# Patient Record
Sex: Female | Born: 1985
Health system: Southern US, Community
[De-identification: ages and names within clinical notes are randomized; demographics above are authoritative.]

## PROBLEM LIST (undated history)

## (undated) DIAGNOSIS — R87810 Cervical high risk human papillomavirus (HPV) DNA test positive: Secondary | ICD-10-CM

## (undated) DIAGNOSIS — A749 Chlamydial infection, unspecified: Secondary | ICD-10-CM

## (undated) DIAGNOSIS — R87610 Atypical squamous cells of undetermined significance on cytologic smear of cervix (ASC-US): Secondary | ICD-10-CM

## (undated) HISTORY — DX: Atypical squamous cells of undetermined significance on cytologic smear of cervix (ASC-US): R87.610

## (undated) HISTORY — PX: COLPOSCOPY W/ BIOPSY / CURETTAGE: SUR283

## (undated) HISTORY — PX: GYNECOLOGIC CRYOSURGERY: SHX857

## (undated) HISTORY — PX: INSERTION OF IMPLANON ROD: OBO 1005

## (undated) HISTORY — PX: POLYPECTOMY: SHX149

## (undated) HISTORY — DX: Cervical high risk human papillomavirus (HPV) DNA test positive: R87.810

## (undated) HISTORY — DX: Chlamydial infection, unspecified: A74.9

---
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
## 2010-06-28 ENCOUNTER — Inpatient Hospital Stay (HOSPITAL_COMMUNITY)
Admission: AD | Admit: 2010-06-28 | Discharge: 2010-06-30 | Payer: Self-pay | Source: Home / Self Care | Attending: Obstetrics | Admitting: Obstetrics

## 2010-06-29 LAB — CBC
HCT: 33.4 % — ABNORMAL LOW (ref 36.0–46.0)
HCT: 31 % — ABNORMAL LOW (ref 36.0–46.0)
Hemoglobin: 10.9 g/dL — ABNORMAL LOW (ref 12.0–15.0)
Hemoglobin: 10.1 g/dL — ABNORMAL LOW (ref 12.0–15.0)
MCH: 28.5 pg (ref 26.0–34.0)
MCHC: 32.6 g/dL (ref 30.0–36.0)
MCV: 87.2 fL (ref 78.0–100.0)
MCV: 88.1 fL (ref 78.0–100.0)
RBC: 3.83 MIL/uL — ABNORMAL LOW (ref 3.87–5.11)

## 2010-06-29 LAB — RPR: RPR Ser Ql: NONREACTIVE

## 2010-10-21 NOTE — Op Note (Signed)
NAMEVIRGA, HALTIWANGER         ACCOUNT NO.:  [id number]   MEDICAL RECORD NO.:  [id number]          PATIENT TYPE:  INP   LOCATION:  0101                         FACILITY:  [facility]   PHYSICIAN:  Angelia Mould. Derrell Lolling, M.D.DATE OF BIRTH:  06-12-1985   DATE OF PROCEDURE:  07/25/2004  DATE OF DISCHARGE:                                 OPERATIVE REPORT   PREOPERATIVE DIAGNOSIS:  Acute appendicitis.   POSTOPERATIVE DIAGNOSIS:  Acute appendicitis.   OPERATION/PROCEDURE:  Laparoscopic appendectomy.   SURGEON:  Angelia Mould. Derrell Lolling, M.D.   OPERATIVE INDICATIONS:  This is a 25 year old Hispanic female.  Her parents  live in [place].  She immigrated here five months ago and works in  [place].  She presented to the [facility] Emergency Room with a 12-hour  history of right lower quadrant pain, nausea and repeated vomiting.  On  examination, she was found to have significant localized tenderness,  guarding and rebound in the right lower quadrant.  A CT scan was obtained  which showed a markedly inflamed appendix with some fluid around it but no  evidence of abscess.  She was brought to the operating room urgently for  appendectomy.   OPERATIVE FINDINGS:  The appendix was acutely inflamed.  There was a little  bit of free fluid and ascites but no evidence of rupture. No evidence of  gangrene.  There was localized exudate with the omentum stuck to the  appendix.  Cecum and terminal ileum looked normal.  The liver and  gallbladder looked normal.  No other abnormalities were noticed.   DESCRIPTION OF PROCEDURE:  Following the induction of general endotracheal  anesthesia, a Foley catheter was inserted.  The abdomen was prepped and  draped in a sterile fashion.  Intravenous antibiotics were given.  0.5%  Marcaine with epinephrine was used as a local infiltration anesthetic.  A  vertically oriented incision was made at the superior rim of the umbilicus.  The fascia was incised in the midline and  abdominal cavity entered under  direct vision.  A 10 mm Hasson trocar was inserted and secured with a  pursestring suture of 0 Vicryl.  Pneumoperitoneum was created.  Video camera  was inserted with visualization and findings as described above.  The  patient was positioned in Trendelenburg and rotated to the left.  A 12 mm  trocar was placed in the left suprapubic area and a 5 mm trocar placed  through the lateral aspect of the left rectus sheath just below the  umbilicus.  We teased and dissected the omentum off the appendix and then  mobilized the appendix until we could identify the tip of the appendix and  traced it all the way back to the insertion on the cecum.  Using the  Harmonic scalpel, we divided some of the lateral peritoneal attachments to  mobilize the appendix and cecum medially so the anatomy became clear.  We  did this very carefully.  We then divided the appendiceal artery and the  mesoappendix with several firings of the Harmonic scalpel until we had  completely skeletonized the appendix and could clearly see its insertion at  the  base of the cecum.  An endo-GIA stapling device was then inserted and  placed across the base of the appendix at the cecum.  The stapler was closed  and held in place for about 40 seconds and then fired and removed.  The  staple line on the appendix and on the cecum were very good.  There was no  bleeding.  The appendix was placed in the specimen bag and removed.   The operative field and the pelvis and the subphrenic space were irrigated  with saline and irrigation fluid was completely clear.  We inspected the  terminal ileum and cecum and everything looked fine.  The trocars were  removed under direct vision and there was no bleeding from the trocar sites.  The pneumoperitoneum was released.  The fascia at the umbilicus and the  fascia at the 12 mm trocar site in the suprapubic area were closed with 0  Vicryl sutures.  These were irrigated  with saline and the skin closed with  subcuticular sutures of 4-0 Monocryl and Steri-Strips.  Kling bandages were  placed and the patient taken to the recovery room in stable condition.  Estimated blood loss was about 10 mL.  No complications.  Sponge, needle and  instrument counts were correct.      HMI/MEDQ  D:  07/23/2004  T:  07/25/2004  Job:  [id number]

## 2010-10-21 NOTE — H&P (Signed)
Crystal Graves, Crystal Graves         ACCOUNT NO.:  [id number]   MEDICAL RECORD NO.:  [id number]          PATIENT TYPE:  INP   LOCATION:  0476                         FACILITY:  [facility]   PHYSICIAN:  Angelia Mould. Derrell Lolling, M.D.DATE OF BIRTH:  1985-06-24   DATE OF ADMISSION:  07/23/2004  DATE OF DISCHARGE:                                HISTORY & PHYSICAL   CHIEF COMPLAINT:  Right lower quadrant abdominal pain and vomiting.   HISTORY OF PRESENT ILLNESS:  This is a 25 year old otherwise healthy  Hispanic female who states that she has had right lower quadrant pain for 12  hours.  This has been progressive.  She has vomited several times.  She  denies diarrhea or alteration in her bowel habits.  She denies fever or  chills.  Last menstrual period was July 18, 2004 and was normal.  She  has never been pregnant.   She came to the emergency room.  She was evaluated by the emergency  department staff.  CT scan was obtained which showed a thickened inflamed  appendix with some fluid around the appendix and was read as acute  appendicitis by the radiologist.  There were no other abnormalities seen on  the CT scan.  I was then called at that point.   She is being admitted for appendectomy.   PAST HISTORY:  She has been healthy.  She has never had any surgical  procedure.  She has no medical problems.  She has never been hospitalized.  She takes no medications and she denies any drug allergies.   SOCIAL HISTORY:  The patient has lived in the [place] for the past 5  months.  Her parents live in [place].  She lives with cousins.  She is  single.  She has a boyfriend here with her tonight and also a female  employer is with her.  She states that she works at the [employer]  here in [place].  She denies the use of alcohol or tobacco.   FAMILY HISTORY:  Mother and father live in [place].  They are living and  well.  No familial disease.   REVIEW OF SYSTEMS:  All systems are  reviewed.  They are noncontributory  except as described above.   PHYSICAL EXAMINATION:  GENERAL:  Thin healthy-appearing Hispanic woman in  moderate distress.  She is cooperative.  The entire encounter is performed  with her female employer present as well as Gerilyn Nestle, an interpreter that is  employed in the emergency room.  VITAL SIGNS:  Blood pressure 104/71, temperature 99.1, pulse 107,  respirations 18, oxygen saturation 99% on room air.  EYES:  Sclerae are clear.  Extraocular movements intact.  EARS/NOSE/MOUTH AND THROAT:  Nose, lips, tongue and oropharynx are without  gross lesions.  NECK:  Supple, nontender, no adenopathy, no thyroid mass, no jugular venous  distention.  LUNGS:  Clear to auscultation, no chest wall tenderness, no CVA tenderness.  HEART:  Regular rate and rhythm with a grade 2/6 systolic murmur, no ectopy.  BREASTS:  Not examined.  ABDOMEN:  Soft, not distended, hypoactive bowel sounds, she has significant  tenderness  in the right lower quadrant and minimal tenderness in the left  lower quadrant, in the right lower quadrant there is involuntary guarding  and direct rebound.  I do not palpate a mass or hernia.  EXTREMITIES:  She moves all four extremities well without pain or deformity.  NEUROLOGIC:  No gross motor or sensory deficits.   ADMISSION DATA:  CT scan shows an inflamed appendix enlarged and thickened  with fluid around it.  White count is 14,100, hemoglobin 14, urine pregnancy  test normal, urinalysis normal, basic metabolic panel normal.   ASSESSMENT:  Acute appendicitis.   PLAN:  1.  The patient will be taken to the operating room for diagnostic      laparoscopy and laparoscopic appendectomy.  2.  I have discussed the indication and details of surgery with her via the      interpreter in great detail.  Risks and complications have been      outlined, including but not limited to bleeding, infection, conversion      to open laparotomy, injury to  adjacent organs such as the intestine or      bladder or ureter with major reconstructive surgery, cardiac, pulmonary      and thromboembolic problems.  She seems to understand these issues well.      At this time all the questions were answered.  She is in full agreement      with this plan and would like to go ahead with the surgery.      HMI/MEDQ  D:  07/23/2004  T:  07/23/2004  Job:  [id number]

## 2014-01-20 ENCOUNTER — Emergency Department (HOSPITAL_COMMUNITY): Payer: BC Managed Care – PPO

## 2014-01-20 ENCOUNTER — Emergency Department (HOSPITAL_COMMUNITY)
Admission: EM | Admit: 2014-01-20 | Discharge: 2014-01-20 | Disposition: A | Discharge status: Home / Self Care | Payer: BC Managed Care – PPO | Attending: Emergency Medicine | Admitting: Emergency Medicine

## 2014-01-20 ENCOUNTER — Encounter (HOSPITAL_COMMUNITY): Payer: Self-pay | Admitting: Emergency Medicine

## 2014-01-20 DIAGNOSIS — S6980XA Other specified injuries of unspecified wrist, hand and finger(s), initial encounter: Secondary | ICD-10-CM | POA: Insufficient documentation

## 2014-01-20 DIAGNOSIS — S0993XA Unspecified injury of face, initial encounter: Secondary | ICD-10-CM | POA: Insufficient documentation

## 2014-01-20 DIAGNOSIS — IMO0002 Reserved for concepts with insufficient information to code with codable children: Secondary | ICD-10-CM | POA: Insufficient documentation

## 2014-01-20 DIAGNOSIS — Z23 Encounter for immunization: Secondary | ICD-10-CM | POA: Diagnosis not present

## 2014-01-20 DIAGNOSIS — T07XXXA Unspecified multiple injuries, initial encounter: Secondary | ICD-10-CM

## 2014-01-20 DIAGNOSIS — S199XXA Unspecified injury of neck, initial encounter: Secondary | ICD-10-CM

## 2014-01-20 DIAGNOSIS — S6990XA Unspecified injury of unspecified wrist, hand and finger(s), initial encounter: Secondary | ICD-10-CM | POA: Diagnosis not present

## 2014-01-20 DIAGNOSIS — S61259A Open bite of unspecified finger without damage to nail, initial encounter: Secondary | ICD-10-CM

## 2014-01-20 DIAGNOSIS — W503XXA Accidental bite by another person, initial encounter: Secondary | ICD-10-CM

## 2014-01-20 MED ORDER — AMOXICILLIN-POT CLAVULANATE 875-125 MG PO TABS: 1.0000 | ORAL_TABLET | Freq: Two times a day (BID) | ORAL | Status: DC

## 2014-01-20 MED ORDER — TETANUS-DIPHTH-ACELL PERTUSSIS 5-2.5-18.5 LF-MCG/0.5 IM SUSP
0.5000 mL | Freq: Once | INTRAMUSCULAR | Status: AC
  Administered 2014-01-20: 0.5 mL via INTRAMUSCULAR
  Filled 2014-01-20: qty 0.5

## 2014-01-20 MED ORDER — AMOXICILLIN-POT CLAVULANATE 875-125 MG PO TABS
1.0000 | ORAL_TABLET | Freq: Once | ORAL | Status: AC
  Administered 2014-01-20: 1 via ORAL
  Filled 2014-01-20: qty 1
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
## 2014-01-20 NOTE — ED Notes (Signed)
Patient's finger dressed with anti-microbial dressing and advised to use neosporin at home. Acknowledges understanding of abx regimen and to take until completed. AVS given in spanish. No other questions/concerns.

## 2014-01-20 NOTE — ED Notes (Signed)
Patient transported to X-ray 
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
## 2014-10-27 ENCOUNTER — Ambulatory Visit (INDEPENDENT_AMBULATORY_CARE_PROVIDER_SITE_OTHER): Payer: BLUE CROSS/BLUE SHIELD | Admitting: Family Medicine

## 2014-10-27 ENCOUNTER — Encounter: Payer: Self-pay | Admitting: Family Medicine

## 2014-10-27 VITALS — BP 100/66 | HR 69 | Ht 66.0 in | Wt 120.0 lb

## 2014-10-27 DIAGNOSIS — Z124 Encounter for screening for malignant neoplasm of cervix: Secondary | ICD-10-CM

## 2014-10-27 DIAGNOSIS — Z30431 Encounter for routine checking of intrauterine contraceptive device: Secondary | ICD-10-CM | POA: Diagnosis not present

## 2014-10-27 DIAGNOSIS — R87612 Low grade squamous intraepithelial lesion on cytologic smear of cervix (LGSIL): Secondary | ICD-10-CM | POA: Insufficient documentation

## 2014-10-27 DIAGNOSIS — Z01419 Encounter for gynecological examination (general) (routine) without abnormal findings: Secondary | ICD-10-CM | POA: Diagnosis not present

## 2014-10-27 NOTE — Progress Notes (Signed)
  Subjective:     Crystal Graves is a 29 y.o. female and is here for a comprehensive physical exam. The patient reports no problems. Has had IUD for 4.5 years, would like to change to Nexplanon.  Has had in past and is aware of bleeding profile.  History   Social History  . Marital Status: Single    Spouse Name: N/A  . Number of Children: N/A  . Years of Education: N/A   Occupational History  . Not on file.   Social History Main Topics  . Smoking status: Current Some Day Smoker  . Smokeless tobacco: Never Used  . Alcohol Use: Yes  . Drug Use: No  . Sexual Activity: Yes    Birth Control/ Protection: IUD   Other Topics Concern  . Not on file   Social History Narrative   Health Maintenance  Topic Date Due  . HIV Screening  05/15/2002  . PAP SMEAR  05/15/2005  . INFLUENZA VACCINE  01/04/2015  . TETANUS/TDAP  01/21/2024    The following portions of the patient's history were reviewed and updated as appropriate: allergies, current medications, past family history, past medical history, past social history, past surgical history and problem list.  Review of Systems A comprehensive review of systems was negative.   Objective:    BP 100/66 mmHg  Pulse 69  Ht 5\' 6"  (1.676 m)  Wt 120 lb (54.432 kg)  BMI 19.38 kg/m2 General appearance: alert, cooperative and appears stated age Head: Normocephalic, without obvious abnormality, atraumatic Neck: no adenopathy, supple, symmetrical, trachea midline and thyroid not enlarged, symmetric, no tenderness/mass/nodules Lungs: clear to auscultation bilaterally Breasts: normal appearance, no masses or tenderness Heart: regular rate and rhythm, S1, S2 normal, no murmur, click, rub or gallop Abdomen: soft, non-tender; bowel sounds normal; no masses,  no organomegaly Pelvic: cervix normal in appearance, external genitalia normal, no adnexal masses or tenderness, no cervical motion tenderness, uterus normal size, shape, and  consistency, vagina normal without discharge and IUD strings could not be visualized Extremities: extremities normal, atraumatic, no cyanosis or edema Pulses: 2+ and symmetric Skin: Skin color, texture, turgor normal. No rashes or lesions Lymph nodes: Cervical, supraclavicular, and axillary nodes normal. Neurologic: Grossly normal    Assessment:    Healthy female exam.      Plan:   Problem List Items Addressed This Visit    None    Visit Diagnoses    Encounter for routine gynecological examination    -  Primary    Screening for malignant neoplasm of cervix        Relevant Orders    Cytology - PAP       Return in 2 weeks (on 11/10/2014) for IUD removal and Nexplanon placement.    See After Visit Summary for Counseling Recommendations

## 2014-10-27 NOTE — Patient Instructions (Addendum)
Eleccin del mtodo anticonceptivo (Contraception Choices) La anticoncepcin (control de la natalidad) es el uso de cualquier mtodo o dispositivo para Therapist, occupational. A continuacin se indican algunos de esos mtodos. [place] contraconceptivo consiste en un tubo plstico delgado que contiene la hormona progesterona. No contiene estrgenos. El mdico inserta el tubo en la parte interna del brazo. El tubo puede Nutritional therapist durante 3 aos. Despus de los 3 aos debe retirarse. El implante impide que los ovarios liberen vulos (ovulacin), espesa el moco cervical, lo que evita que los espermatozoides ingresen al tero y hace ms delgada la membrana que cubre el interior del tero.  Inyecciones de progesterona sola: las Water engineer cada 3 meses para Therapist, occupational. La progesterona sinttica impide que los ovarios liberen vulos. Tambin hacen que el moco cervical se espese y modifique el tejido de recubrimiento interno del tero. Esto hace ms difcil que los espermatozoides sobrevivan en el tero.  Las pldoras anticonceptivas contienen estrgenos y Immunologist. Su funcin es [employer] ovarios liberen vulos (ovulacin). Las hormonas de los anticonceptivos orales hacen que el moco cervical se haga ms espeso, lo que evita que el esperma ingrese al tero. Las pldoras anticonceptivas son recetadas por el mdico.Tambin se utilizan para tratar los perodos menstruales abundantes.  Minipldora: este tipo de pldora anticonceptiva contiene slo hormona progesterona. Deben tomarse todos los das del mes y debe recetarlas el mdico.  El parche de control de natalidad: contiene hormonas similares a las que contienen las pldoras anticonceptivas. Deben cambiarse una vez por semana y se utilizan bajo prescripcin mdica.  Anillo vaginal: contiene hormonas similares a las que contienen las pldoras anticonceptivas. Se deja colocado durante tres  semanas, se lo retira durante 1 semana y luego se coloca uno nuevo. La paciente debe sentirse cmoda al insertar y retirar el anillo de la vagina.Es necesaria la prescripcin mdica.  Anticonceptivos de emergencia: son mtodos para evitar un embarazo despus de Ardelia Mems relacin sexual sin proteccin. Esta pldora puede tomarse inmediatamente despus de Office manager sexuales o hasta 5 [place] de haber tenido sexo sin proteccin. Es ms efectiva si se toma poco tiempo despus de la relacin sexual. Los anticonceptivos de emergencia estn disponibles sin prescripcin mdica. Consltelo con su farmacutico. No use los anticonceptivos de emergencia como nico mtodo anticonceptivo. MTODOS DE [place]   Condn masculino: es una vaina delgada (ltex o goma) que se coloca cubriendo al pene durante el acto sexual. Grier Rocher con espermicida para aumentar la efectividad.  Condn femenino. Es una funda delicada y blanda que se adapta holgadamente a la vagina antes de las Armed forces operational officer.  Diafragma: es una barrera de ltex redonda y suave que debe ser recomendado por un profesional. Se inserta en la vagina, junto con un gel espermicida. Debe insertarse antes de Clinical biochemist. Debe dejar el diafragma colocado en la vagina durante 6 a 8 horas despus de la relacin sexual.  Capuchn cervical: es una barrera de ltex o taza plstica redonda y [place] que cubre el cuello del tero y debe ser colocada por un mdico. Puede dejarlo colocado en la vagina hasta 48 horas despus [place].  Esponja: es una pieza blanda y circular de espuma de poliuretano. Contiene un espermicida. Se inserta en la vagina despus de mojarla y antes de las [employer].  Espermicidas: son sustancias qumicas que matan o bloquean al esperma y no lo dejan ingresar al cuello del tero y al tero.  Vienen en forma de cremas, geles, supositorios, espuma o comprimidos. No es necesario tener Furniture conservator/restorer. Se  insertan en la vagina con un aplicador antes de Clinical biochemist. El proceso debe repetirse cada vez que tiene relaciones sexuales. ANTICONCEPTIVOS INTRAUTERINOS  Dispositivo intrauterino (DIU) es un dispositivo en forma de T que se coloca en el tero durante el perodo menstrual, para Therapist, occupational. Hay dos tipos:  DIU de cobre: este tipo de DIU est recubierto con un alambre de cobre y se inserta dentro del tero. El cobre hace que el tero y las trompas de Falopio produzcan un liquido que [employer] espermatozoides. Puede permanecer colocado durante 10 aos.  DIU con hormona: este tipo de DIU contiene la hormona progestina (progesterona sinttica). La hormona espesa el moco cervical y evita que los espermatozoides ingresen al tero y tambin afina la membrana que cubre el tero para evitar la implantacin del vulo fertilizado. La hormona debilita o destruye los espermatozoides que ingresan al tero. Puede Nutritional therapist durante 3-5 aos, segn el tipo de DIU que se [place]. MTODOS ANTICONCEPTIVOS PERMANENTES  Ligadura de trompas en la mujer: se realiza sellando, atando u obstruyendo quirrgicamente las trompas de Falopio lo que impide que el vulo descienda hacia el tero.  Esterilizacin histeroscpica: Implica la colocacin de un pequeo espiral o la insercin en cada trompa de Falopio. El mdico utiliza una tcnica llamada histeroscopa para Teacher, music procedimiento. El dispositivo produce la formacin de tejido Pensions consultant. Esto da como resultado una obstruccin permanente de las trompas de Falopio, de modo que la esperma no pueda fertilizar el vulo. Demora alrededor de 3 meses despus del procedimiento hasta que el conducto se obstruye. Tendr que usar otro mtodo anticonceptivo durante al menos 3 meses.  Esterilizacin masculina: se realiza ligando los conductos por los que pasan los espermatozoides (vasectoma).Esto impide que el esperma ingrese a la vagina  durante el acto sexual. Luego del procedimiento, el hombre puede eyacular lquido (semen). MTODOS DE PLANIFICACIN NATURAL  Planificacin familiar natural: consiste en no Clinical biochemist o usar un mtodo de barrera (condn, [place], capuchn cervical) en los [employer] Insurance la mujer podra quedar [place].  Mtodo de calendario: consiste en el seguimiento de la duracin de cada ciclo menstrual y la identificacin de los perodos frtiles.  Mtodo de ovulacin: Dance movement psychotherapist las relaciones sexuales durante la ovulacin.  Mtodo sintotrmico: Energy manager sexuales en la poca en la que se est ovulando, utilizando un termmetro y tendiendo en cuenta los sntomas de la ovulacin.  Mtodo postovulacin: Museum/gallery conservator las relaciones sexuales para despus de haber ovulado. Independientemente del tipo o mtodo anticonceptivo que usted elija, es importante que use condones para protegerse contra las infecciones de transmisin sexual (ETS). Hable con su mdico con respecto a qu mtodo anticonceptivo es el ms apropiado para usted. Document Released: 05/22/2005 Document Revised: 01/22/2013 [facility] Patient Information 2015 [place]. This information is not intended to replace advice given to you by your health care provider. Make sure you discuss any questions you have with your health care provider.  Cuidados preventivos en los adultos (Preventive Care for Adults) Un estilo de vida saludable y los cuidados preventivos pueden favorecer la salud y [place]. Las pautas de salud preventivas para las mujeres incluyen las siguientes prcticas clave:  Un examen fsico de rutina anual y Optometrist estudios preventivos es un buen modo de Chief Technology Officer su salud. [place] de Publishing rights manager preocupaciones y Air traffic controller de  su salud, y que le realicen estudios completos.  Consulte al dentista para realizar un examen de rutina y cuidados preventivos cada 6  meses. Cepllese los dientes al Toys[employer]us veces por da y psese el hilo dental al menos una vez por da. Una buena higiene bucal evita caries y enfermedades de las encas.  La frecuencia con que debe hacerse exmenes de la vista depende de su edad, su estado de [place], su historia familiar, el uso de lentes de contacto y otros factores. Siga las recomendaciones del mdico para saber con qu frecuencia debe hacerse exmenes de la vista.  Consuma una dieta saludable. Los alimentos que contienen vegetales, las frutas, los cereales [place], los productos lcteos bajos en grasas y las protenas magras contienen nutrientes que son necesarios, sin consumir Nurse, mental health. Disminuya la ingesta de alimentos ricos en grasas slidas, azcares y sal agregadas. Consuma la cantidad de caloras adecuada para usted. Si es necesario, pdale informacin acerca de una dieta [place] a su mdico.  Realizar actividad fsica de forma regular es una de las prcticas ms importantes que puede hacer por su salud. Los adultos deben hacer al menos 150 minutos de ejercicios de intensidad moderada (cualquier actividad que aumente la frecuencia cardaca y lo haga transpirar) cada semana. Adems, la [employer] de los adultos necesita practicar ejercicios de fortalecimiento muscular dos o ms veces por semana.  Mantenga un peso saludable. El ndice de masa corporal [facility]) es una herramienta que identifica posibles problemas con [place]. Proporciona una estimacin de la grasa corporal basndose en el peso y la altura. El mdico podr determinar su [facility] y ayudarlo a Scientist, forensic o Theatre manager un peso saludable. Para los adultos de 20 aos o ms:  Un [facility] menor de 18,5 se considera bajo peso.  Un [facility] entre 18,5 y 24,9 es normal.  Un [facility] entre 25 y 29,9 se considera sobrepeso.  Un IMC de 30 o ms se considera obesidad.  Realice actividad fsica y evite ingerir grasas saturadas para mantener un nivel normal de lpidos y Psychiatric nurse. Consuma una dieta equilibrada y saludable, e incluya variedad de frutas y Photographer. A partir de los 20 aos se deben realizar anlisis de sangre a fin de Freight forwarder nivel de lpidos y colesterol en la sangre y [place] cada 5 aos. Si los niveles de lpidos o colesterol son altos, tiene ms de 50 aos o tiene riesgo elevado de sufrir enfermedades cardacas, Designer, industrial/product controlarse con ms frecuencia. Si tiene Coca Cola de lpidos y colesterol, debe recibir tratamiento con medicamentos, si la dieta y el ejercicio no estn funcionando.  Si fuma, consulte con el mdico acerca de las opciones para dejar de [place]. Si no fuma, no comience.  Se recomienda realizar exmenes de deteccin de cncer de pulmn a personas adultas entre 23 y 33 aos que estn en riesgo de Horticulturist, commercial de pulmn por sus antecedentes de consumo de tabaco. Para quienes hayan fumado durante 30 aos un paquete diario, y sigan fumando o hayan dejado el hbito en algn momento en los ltimos 15 aos, se recomienda realizarse una tomografa computarizada de baja dosis de los pulmones todos los aos. Fumar durante un ao-paquete equivale a fumar un promedio de un paquete de cigarrillos diario durante un ao (por ejemplo: un paquete por da durante [place] paquetes por da durante 15 aos). Los exmenes anuales deben continuar hasta que el fumador haya dejado de fumar durante un mnimo de 15 aos. Ya no deben realizarse  en personas que tengan un problema de salud que les impida recibir tratamiento para el cncer de pulmn.  Si est embarazada, no beba alcohol. Si est amamantando, beba alcohol con prudencia. Si no est embarazada y decide beber alcohol, no beba ms de Naval architect. Se considera una medida a 12onzas (340m) de cerveza, 5onzas (1461m de vino, o 1,4,[id number] licor.  Evite el consumo de drogas. No comparta agujas. Pida ayuda si necesita asistencia o instrucciones con respecto a  abandonar el consumo de drogas.  La hipertensin arterial causa enfermedades cardacas y auSerbial riesgo de ictus. Debe controlar su presin arterial al menos cada uno o doEagle RiverLa hipertensin arterial que persiste debe tratarse con medicamentos si la prdida de peso y el ejercicio no son efectivos.  Si tiene entre [id number], consulte a su mdico si debe tomar aspirina para prevenir ictus.  Los anlisis para la diabetes incluyen la toma de unTanzaniae sangre para controlar el nivel de azcar en la sangre durante el ayOrange CityDebe hacerlo cada 3 aos despus de los 4552os si est dentro de su peso normal y sin factores de riesgo para la diabetes. Las pruebas deben comenzar a edades tempranas o llevarse a cabo con ms frecuencia si tiene sobrepeso y al menos un factor de riesgo para la diabetes.  Las evaluaciones para dePublic affairs consultante mama son un mtodo preventivo fundamental para las mujeres. Debe practicar la "autoconciencia de las mamas". Esto significa que deChief Technology Officerpariencia normal de sus mamas y cmo se sienten, y haElectrical engineern autoexamen de maGlass blower/designerSi detecta algn cambio, no importa cun pequeo sea, debe informarlo a su mdico. Las muConAgra Foods0 y 3086os deben hacerse un examen clnico de las mamas como parte del examen regular de saEast Pecoscada uno a tres aos. Despus de los [id number]deben haCoca-ColaA partir de los [id number] [place] Lanedeben hacerse una mamografa (radiografa de mamas) cada ao. Las mujeres con antecedentes familiares de cncer de mama deben hablar con el mdico para someterse a un estudio gentico. Las que tienen ms riesgo deben hacerse una resonancia magntica y unLavinia Sharpsodos loRed Cliff La evaluacin del riesgo de cncer relacionado con el gen del cncer de mama (BRCA) se recomienda a mujeres que tengan familiares con cncer relacionado con el BRCA. Los cnceres relacionados con el BRCA incluyen el cncer de mama, de ovario, de trompas y  peritoneal. TeRaynelle Janamiliares con estos cnceres puede estar asociado con un mayor riesgo de cambios dainos (mutaciones) en los genes del cncer de mama BRCA1 y BRCA2. Los resultados de la evaluacin determinarn la necesidad de asesoramiento gentico y de anFelidae BRCA1 y BRCA2.  Ya no se recomiendan los exmenes plvicos de rutina para la deteccin del cncer en las mujeres que no estn embarazadas que son consideradas sujetos de bajo riesgo de teBest boyncer de los rganos de la pelvis (ovarios, tero y vagina) y no tienen sntomas. Pregntele al mdico si un examen plvico de deteccin es adecuado para usted.  Si ha recibido un tratamiento para elScience writerervical o una enfermedad que podra causar cncer, necesitar realizarse una prueba de Papanicolaou y controles durante al menos 2061os de concluido el trAllenSi no se ha hecho el Papanicolaou con regularidad, debern volver a evaluarse los factores de riesgo (como tener un nuevo compaero sexual), para deTeacher, adult educationi debe realizarse los estudios nuevamente. Algunas mujeres sufren problemas mdicos  que aumentan la probabilidad de Museum/gallery curator cncer de cuello del tero. En estos casos, el mdico podr [employer] se realicen controles y pruebas de Papanicolaou con ms frecuencia.  La prueba del VPH es un anlisis adicional que puede usarse para Film/video editor de cuello del tero. Esta prueba busca la presencia del virus que causa los cambios en el cuello. Las clulas que se recolectan durante la prueba de Papanicolaou pueden usarse para el VPH. Se debe realizar la prueba para la deteccin del VPH a mujeres de ms de 55 aos y a Midwife de cualquier edad [place] de la prueba de Papanicolaou no sean claros. Despus de los 30 aos, las mujeres deben hacerse el anlisis para el VPH con la misma frecuencia que la prueba de Papanicolaou.  El cncer colorrectal puede detectarse y con frecuencia puede prevenirse. La mayor parte de los estudios de  rutina se deben Medical laboratory scientific officer a Field seismologist a Proofreader de los 33 aos y [place] 56 aos. Sin embargo, el mdico podr aconsejarle que lo haga antes, si tiene factores de riesgo para el cncer de colon. Una vez por ao, el mdico le dar un kit de prueba para Hydrologist en la materia fecal. La utilizacin de un tubo con una pequea cmara en su extremo para examinar directamente el colon (sigmoidoscopa o colonoscopa), puede detectar formas tempranas de cncer colorrectal. Hable con su mdico si tiene 59 aos, edad en la que debe comenzar a Optometrist los estudios de [place]. El examen directo del colon debe repetirse cada 5 a 10 aos, hasta los 75 aos, excepto que se encuentren formas tempranas de plipos precancerosos o pequeos bultos.  Las personas con un riesgo mayor de Insurance risk surveyor hepatitis B deben realizarse anlisis para Futures trader virus. Se considera que tiene un alto riesgo de Museum/gallery curator hepatitis B si:  Naci en un pas donde la hepatitis B es frecuente. Pregntele a su mdico qu pases son considerados de Public affairs consultant.  Sus padres nacieron en un pas de alto riesgo y usted no recibi una vacuna que lo proteja contra la hepatitis B (vacuna contra la hepatitis B).  [place].  [place] agujas para inyectarse drogas.  Vive o tiene sexo con alguien que tiene hepatitis B.  Recibe tratamiento de hemodilisis.  Toma ciertos medicamentos para Nurse, mental health, trasplante de rganos y afecciones autoinmunes.  Se recomienda realizar un anlisis de sangre para Hydrographic surveyor hepatitis C a todas las personas nacidas entre 1945 y 1965, y a todo aquel que sepa que tiene riesgo de haber contrado esta enfermedad.  Practique el sexo seguro. Use condones y evite las prcticas sexuales riesgosas para disminuir el contagio de enfermedades de transmisin sexual (ETS). Algunas ETS son la gonorrea, clamidia, sfilis, tricomoniasis, herpes, VPH y el virus de inmunodeficiencia humana (VIH). El herpes, el VIH y  [place] VPH son enfermedades virales que no tienen [place]. Pueden producir discapacidad, cncer y [employer] Corporation.  Debe realizarse pruebas de deteccin de enfermedades de transmisin sexual (ETS), incluidas gonorrea y clamidia si:  Es sexualmente activa y es menor de 24aos.  Es mayor de 24aos, y Investment banker, operational informa que corre riesgo de tener este tipo de [place].  La actividad sexual ha cambiado desde que le hicieron la ltima prueba de deteccin y tiene un riesgo mayor de Best boy clamidia o Radio broadcast assistant. Pregntele al mdico si usted tiene riesgo.  Si tiene riesgo de infectarse por el VIH, se recomienda tomar diariamente un medicamento recetado para evitar la infeccin.  Esto se conoce como profilaxis previa a la exposicin. Se considera que est en riesgo si:  Es Dalene Seltzer heterosexual, es sexualmente [place] y tiene ms riesgo de Museum/gallery curator una infeccin por VIH.  Se inyecta drogas.  Es sexualmente activo con una pareja que tiene VIH.  Consulte a su mdico para saber si tiene un alto riesgo de infectarse por el VIH. Si opta por comenzar la profilaxis previa a la exposicin, primero debe realizarse anlisis de deteccin del VIH. Luego, le harn anlisis cada 24mses mientras est tomando los medicamentos para la profilaxis previa a la exposicin.  La osteoporosis es una enfermedad en la que los huesos pierden los minerales y la fuerza por el avance de la edad. El resultado pueden ser fracturas o quebraduras graves en l[place] El riesgo de osteoporosis puede identificarse con uArdelia Memsprueba de densidad sea. Las mujeres de ms de 614aos y las que tengan riesgos de sufrir fracturas u osteoporosis deben discutir las opciones de control con su mdico. Consulte a su mdico si debe tomar un suplemento de calcio o de vitamina D para reducir el riesgo de osteoporosis.  La menopausia est asociada a sntomas y riesgos fsicos. Se dispone de una terapia de reemplazo hormonal para disminuir los sntomas y l[place] Consulte a su mdico para saber si la terapia de reemplazo hormonal es conveniente para usted.  Utilice pantalla solar. Aplique pantalla solar de mKerry Doryy repetida a lo largo del dTraining and development officer Resgurdese del sol cuando la sombra sea ms pequea que usted. Protjase usando mangas y pantalones largos, un sombrero de ala ancha y anteojos para el sol todo el ao, siempre que se encuentre al aDranesville  Una vez por mes hgase un examen de la piel de todo el cuerpo usando un espejo para ver la espalda. Informe al mdico si aparecen nuevos lunares, o si nota que los que ya tena ahora tienen bordes iSt. Mary aumentaron su tamao y son ms grandes que una goma de lpiz o si su forma o color cambi.  Mantngase al da con las vacunas obligatorias .  Vacuna antigripal. Todas las personas adultas deben vacunarse cada ao.  Vacuna contra la difteria, ttanos y pAdvice worker(DT, DTPa). Las mujeres embarazadas deben recibir una dosis de la vacuna DTPa en cada embarazo. Se debe recibir la dosis independientemente de cunto tiempo haya transcurrido desde la ltima dosis. Es preferible vacunarse entre la semana 244y 339de la gestacin. Una persona adulta que no haya recibido la vacuna DTPa anteriormente o que no sabe su estado de vacunacin debe recibir una dosis. Despus de esta dosis inicial, necesitar aplicarse un refuerzo de la vacuna contra la difteria y el ttanos (DT) cada 10 aos. Las pShip brokerque no sepan o no hayan recibido la serie de vacunacin de tres dosis contra la difteria y el ttanos deben iniciar o finalizar una serie de vacunacin primaria, que incluye la dosis contra la difteria, el ttanos y lResearch officer, trade union(DTPa). Las personas adultas deben recibir una dosis de refuerzo de DT cada 10 aos.  Vacuna contra la varicela. UArdelia Memspersona adulta sin prueba de inmunidad a la varicela debe recibir dos dosis o una segunda dosis si recibi una dosis previamente. Las embarazadas sin  prueba de inmunidad deben recibir la primera dosis despus del eMedia planner Esta primera dosis se debe aplicar antes del alta del centro de salud. La segunda dosis debe aplicarse entre 4 y 8 semanas posteriores a la primera dosis.  VEdward Jolly  contra el virus del Engineer, technical sales (VPH). Las ConAgra Foods 13 y 20 aos que no hayan recibido la vacuna antes deben recibir la serie de 3 dosis. La vacuna no se recomienda en mujeres embarazadas. Sin embargo, no es Chartered loss adjuster una prueba de [place] antes de recibir una dosis. Si se descubre que una mujer est embarazada despus de recibir la dosis, no se Producer, television/film/video. En ese caso, las dosis restantes deben retrasarse hasta despus del embarazo. Se recomienda la vacuna para cualquier persona inmunodeprimida hasta la edad de 26 aos si no recibi [place] o ninguna de las dosis anteriormente. Durante la serie de 3 dosis, la segunda dosis debe [employer] Products 4 y 8 semanas posteriores a la primera dosis. La tercera dosis debe aplicarse 24 semanas despus de la primera dosis y 16 semanas despus de la segunda dosis.  Vacuna contra el herpes zoster. Se recomienda una dosis en personas [employer] de 90 aos a menos que sufran ciertas enfermedades.  [place] contra el sarampin, la rubola y las paperas (SRP) Los adultos nacidos antes de 1957 generalmente se consideran inmunes al sarampin y las paperas. Las Forensic scientist en [phone] o posteriormente deben recibir una o ms dosis de la vacuna SRP, a menos que [employer] Mutual [employer] Omaha contraindicacin para la vacuna o que tengan prueba de inmunidad a las tres enfermedades. Se debe aplicar una segunda dosis de rutina de la vacuna SRP al menos 28das despus de la primera dosis a estudiantes de escuelas terciarias, trabajadores de la salud o viajeros internacionales. Las personas que recibieron la vacuna inactivada contra el sarampin o algn tipo desconocido de vacuna contra el sarampin [place] y 1967 deben recibir  dos dosis de la vacuna [place]. Las Advertising copywriter recibieron la vacuna inactivada contra las paperas o algn tipo desconocido de vacuna contra las paperas antes de 1979 y tienen un alto riesgo de infectarse con la enfermedad deben considerar vacunarse con dos dosis de la vacuna SRP. En las mujeres en edad frtil, debe determinarse la inmunidad contra la rubola. Si no hay prueba de inmunidad, las mujeres que no estn embarazadas deben vacunarse. Si no hay prueba de inmunidad, las mujeres que estn embarazadas deben retrasar la vacunacin hasta despus del [place]. Los trabajadores de [employer]	[place] no vacunados que nacieron antes de 1957 y que no tienen prueba de inmunidad contra el sarampin, la rubola y las paperas o no tienen confirmacin de laboratorio de la enfermedad deben considerar vacunarse contra el sarampin y las paperas con dos dosis de la vacuna [place], y contra la rubola con una dosis de la vacuna SRP.  Vacuna antineumoccica conjugada 13 valente (PCV13). Segn indicacin mdica, una persona que no conozca su historia de vacunacin y no tenga registro de vacunas debe recibir la vacuna PCV13. Una persona de 19 aos o ms que tenga ciertas enfermedades y no se haya vacunado debe recibir una dosis de la vacuna PCV13. Despus de esta vacuna, se debe aplicar una dosis de la vacuna antineumoccica de polisacridos (PPSV23). La dosis de la vacuna PPSV23 se debe recibir al menos ocho semanas despus de la dosis de la vacuna PCV13. Una persona de 19 aos o ms, que tenga ciertas enfermedades y Recruitment consultant recibido una o ms dosis de la vacuna PPSV23 previamente debe recibir una dosis de la vacuna PCV13. La dosis de la vacuna KNL97 se debe aplicar uno o ms aos despus de la dosis de la vacuna PPSV23.  Vacuna antineumoccica de polisacridos (PPSV23). Si se indica  la vacuna PCV13, primero debe recibir la vacuna PCV13. Todas las personas de 65 aos o mayores deben recibir la vacuna. Una Network engineer de 23 aos que tenga  ciertas enfermedades se Teacher, English as a foreign language. Cleora Fleet persona que viva en un hogar de Mining engineer o en un centro de atencin durante mucho tiempo se debe vacunar. Un fumador adulto se Teacher, English as a foreign language. Las personas inmunodeprimidas o con otras enfermedades deben recibir ambas vacunas, PCV13 y PPSV23. Las personas infectadas con el virus de la inmunodeficiencia humana (VIH) deben recibir la vacuna lo antes posible despus del diagnstico. Se debe evitar la vacunacin durante tratamientos de quimioterapia y radioterapia. El uso de rutina de la vacuna PPSV23 no est recomendado para Teacher, early years/pre, personas nativas de [place] o [employer] de 65aos, salvo que tengan ciertas enfermedades que requieran la vacuna. Segn indicacin mdica, las personas que no conozcan su historia de vacunacin y no tengan registros de [employer], deben recibir la vacuna PPSV23. Se recomienda una nica revacunacin 5 aos despus de recibir la primera dosis de PPSV23 para personas de 19 a 30 aos con insuficiencia renal crnica, sndrome nefrtico, asplenia o inmunodepresin. Las [place] [employer] recibieron de una a dos dosis de PPSV23 antes de los 65 aos deben recibir otra dosis de [place] a los 65 aos de edad o posteriormente si pasaron cinco aos, como mnimo, desde la dosis anterior. Las dosis de PPSV23 no son necesarias para personas que ya recibieron la vacuna a los 59 aos o posteriormente.  Vacuna antimeningoccica. Los adultos con asplenia o con persistentes deficiencias de componentes terminales del complemento deben recibir dos dosis de la vacuna antimeningoccica conjugada tetravalente (MenACWY-D). Las dosis se deben Midwife con un mnimo de 2 meses de diferencia. Deben vacunarse los microbilogos que trabajan con ciertas bacterias meningoccicas, reclutas militares y personas que viajan o viven en pases con una alta tasa de meningitis. Los estudiantes universitarios de Tourist information centre manager la edad de 21 que vivan en una residencia estudiantil  deben recibir una dosis si no se aplicaron la vacuna cuando cumplieron o despus de cumplir 16 aos. Las personas que sufren ciertas enfermedades de alto riesgo deben aplicarse una o ms dosis.  Vacuna contra la hepatitis A. Las Advertising copywriter deseen estar protegidas contra esta enfermedad, que sufren ciertas enfermedades de alto riesgo, que trabajan con animales infectados con hepatitis A, que trabajan en los laboratorios de investigacin de hepatitis A, o que viajan o trabajan en pases con una alta tasa de hepatitis A deben recibir la vacuna. Los personas que no fueron vacunadas previamente y Deno Etienne a tener un contacto cercano con una persona adoptada fuera del pas, deben recibir la vacuna durante los primeros [address] despus de su llegada a los [place] desde un pas con una alta tasa de hepatitis A.  Vacuna contra la hepatitis B. Las [place] [employer] deseen estar protegidas contra esta enfermedad, que sufren ciertas enfermedades de alto riesgo, que puedan estar expuestas a sangre u otros fluidos corporales infecciosos, que tienen contactos familiares o parejas sexuales con hepatitis B positivo, que sean clientes o trabajadores de ciertos centros de atencin, o que viajan o trabajan en pases con una alta tasa de hepatitis B deben recibir la vacuna.  Vacuna antihaemophilus influenzae tipo B (Hib). Una persona no vacunada previamente, que sufra de asplenia o de anemia drepanoctica, o que tenga una esplenectoma programada debe recibir una dosis de la vacuna Hib. Independientemente de la vacunacin previa, un paciente trasplantado con clulas madre hematopoyticas debe recibir Ardelia Mems serie  de tres dosis, de 6 a 12 meses despus del trasplante exitoso. La vacuna Hib no est recomendada para personas adultas infectadas con VIH. Controles preventivos - Rayetta Pigg Entre 19 y 41aos  Control de la presin arterial.**/Cada uno a [employer].  Control de lpidos y colesterol.**Carma Lair cinco aos a partir de los 20  aos.  Examen clnico de [employer].**Carma Lair 3 aos en las [employer] 20 y los 26 aos.  Evaluacin del riesgo de cncer relacionado con el BRCA.**/Para mujeres que tienen familiares con cncer relacionado con el BRCA (cncer de mama, de ovario, de trompas y peritoneal).  Prueba de Papanicolaou.**Carma Lair dos [employer] 21 y los 20 aos. Cada tres aos a Proofreader de los 30 aos y [place] 26 o 41 aos, con una historia de tres pruebas de Papanicolaou normales consecutivas.  Pruebas de deteccin de VPH.**/Cada tres aos, a partir de los 8 aos y [place] 47 o 32 aos, con una historia de tres pruebas de Papanicolaou normales consecutivas.  Anlisis de sangre para la hepatitis C.**/Para toda persona con riesgos conocidos de hepatitis C.  Autoexamen de piel /todos los meses  [place] antigripal. [place] los aos  Vacuna contra la difteria, ttanos y Advice worker (DTPa, DT).**/Consulte a su mdico. Las mujeres embarazadas deben recibir una dosis de la vacuna DTPa en cada embarazo. Una dosis de DT cada 10 aos.  Vacuna contra la varicela.**/Consulte a su mdico. Las embarazadas sin prueba de inmunidad deben recibir la primera dosis despus del Media planner.  Vacuna contra el virus del Engineer, technical sales (VPH). /3 dosis en el curso de 6 meses, si tiene 49 aos o menos. La vacuna no se recomienda en mujeres embarazadas. Sin embargo, no es Chartered loss adjuster una prueba de [place] antes de recibir una dosis.  Vacuna contra el sarampin, la rubola y las paperas ([place]).Marland KitchenEarleen Newport aplicarse al menos una dosis de SRP si ha nacido en 1957 o despus. Podra tambin necesitar una 2.da dosis. En las mujeres en edad frtil, debe determinarse la inmunidad contra la rubola. Si no hay prueba de inmunidad, las mujeres que no estn embarazadas deben vacunarse. Si no hay prueba de inmunidad, las mujeres que estn embarazadas deben retrasar la vacunacin hasta despus del [place].  Vacuna antineumoccica conjugada  13 valente (PCV13).Marland KitchenCecille Amsterdam a su mdico.  Vacuna antineumoccica de polisacridos (PPSV23).**/De una a dos dosis si es fumador o si sufre Actuary.  Vacuna antimeningoccica.**/Si tiene entre 24 y 101 aos y es estudiante universitario de Editor, commissioning que vive en una residencia estudiantil o tiene alguna enfermedad grave, debe recibir Ardelia Mems dosis de esta vacuna. Podra tambin necesitar dosis de refuerzo.  Vacuna contra la hepatitis A.**/Consulte a su mdico.  Vacuna contra la hepatitis B.**/Consulte a su mdico.  Vacuna antihaemophilus influenzae tipo B (Hib).**/Consulte a su mdico. Entre 40 y 99aos  Control de la presin arterial.**/Cada uno a International aid/development worker.  Control de lpidos y colesterol.**Carma Lair cinco aos a partir de los 20 aos.  Pruebas de deteccin de cncer de pulmn. /Todos los aos si tiene entre 61 y 28 aos, y si ha fumado durante 68 aos un paquete diario y sigue fumando o dej el hbito en algn momento en los ltimos 15 aos. Los estudios de Pharmacologist se interrumpen cuando haya dejado de fumar durante al menos 15aos o si tiene un problema de salud que le impida recibir tratamiento para Science writer de pulmn.  Examen clnico de [employer].**/Todos los aos despus de los 40 aos.  Evaluacin del  riesgo de cncer relacionado con el BRCA.**/Para mujeres que tienen familiares con cncer relacionado con el BRCA (cncer de mama, de ovario, de trompas y peritoneal).  Mamografa.**/Una vez por ao a partir de los 18 aos, siempre que tenga buena salud. Consulte a su mdico.  Prueba de Papanicolaou.Marland KitchenCarma Lair tres aos despus de los 45 aos y [place] 69 o 86 aos, con una historia de tres pruebas de Papanicolaou normales consecutivas.  Pruebas de deteccin de VPH.**/Cada tres aos, a partir de los 73 aos y [place] 90 o 90 aos, con una historia de tres pruebas de Papanicolaou normales consecutivas.  Prueba de sangre oculta en materia fecal. Carma Lair ao a partir de  los 50 [employer] 75 aos. No tendr que hacerlo si se realiza una colonoscopa cada 10 aos.  Colonoscopa o sigmoidoscopa flexible.Marland KitchenCarma Lair 5 aos para la sigmoidoscopa flexible o cada 10 aos para la colonoscopa, a Proofreader de los 5 aos de edad y [place] 34 aos.  Anlisis de sangre para la hepatitis C.**/Para todas las personas nacidas entre 1945 y 1965, y a todo aquel que tenga un riesgo conocido de haber contrado esta enfermedad.  Autoexamen de piel /todos los meses  [place] antigripal. /Todos los aos  Vacuna contra la difteria, ttanos y Advice worker (DTPa/DT).**/Consulte a su mdico. Las mujeres embarazadas deben recibir una dosis de la vacuna DTPa en cada embarazo. Una dosis de DT cada 10 aos.  Vacuna contra la varicela.**/Consulte a su mdico. Las embarazadas sin prueba de inmunidad deben recibir la primera dosis despus del Media planner.  Vacuna contra el herpes zoster.Marland KitchenArdelia Mems dosis para adultos de 60 aos o ms.  Vacuna contra el sarampin, la rubola y las paperas ([place]).Marland KitchenEarleen Newport aplicarse al menos una dosis de SRP si ha nacido en 1957 o despus. Podra tambin necesitar una 2.da dosis. En las mujeres en edad frtil, debe determinarse la inmunidad contra la rubola. Si no hay prueba de inmunidad, las mujeres que no estn embarazadas deben vacunarse. Si no hay prueba de inmunidad, las mujeres que estn embarazadas deben retrasar la vacunacin hasta despus del [place].  Vacuna antineumoccica conjugada 13 valente (PCV13).Marland KitchenCecille Amsterdam a su mdico.  Vacuna antineumoccica de polisacridos (PPSV23).**/De una a dos dosis si es fumador o si sufre Actuary.  Vacuna antimeningoccica.Marland KitchenCecille Amsterdam a su mdico.  Investment banker, operational contra la hepatitis A.**/Consulte a su mdico.  Vacuna contra la hepatitis B.**/Consulte a su mdico.  Vacuna antihaemophilus influenzae tipo B (Hib).**/Consulte a su mdico. Ms de 55 aos  Control de la presin arterial.**/Cada uno a Management consultant.  Control de lpidos y colesterol.**Carma Lair cinco aos a partir de los 20 aos.  Pruebas de deteccin de cncer de pulmn. /Todos los aos si tiene entre 55 y 59 aos, y si ha fumado durante 40 aos un paquete diario y sigue fumando o dej el hbito en algn momento en los ltimos 15 aos. Los estudios de Pharmacologist se interrumpen cuando haya dejado de fumar durante al menos 15aos o si tiene un problema de salud que le impida recibir tratamiento para Science writer de pulmn.  Examen clnico de [employer].**/Todos los aos despus de los 40 aos.  Evaluacin del riesgo de cncer relacionado con el BRCA.**/Para mujeres que tienen familiares con cncer relacionado con el BRCA (cncer de mama, de ovario, de trompas y peritoneal).  Mamografa.**/Una vez por ao a partir de los 82 aos, siempre que tenga buena salud. Consulte a su mdico.  Prueba de Papanicolaou.**Carma Lair tres aos despus de los 24 aos y  hasta los 65 o 70 aos, con tres pruebas de Papanicolaou normales consecutivas. Las pruebas pueden interrumpirse [employer] 59 y los 43 aos, si tiene tres pruebas de Papanicolaou normales consecutivas y ninguna prueba de Papanicolaou ni de VPH anormal en los ltimos 10 aos.  Pruebas de deteccin de VPH.**/Cada tres aos, a partir de los 56 aos y [place] 32 o 28 aos, con una historia de tres pruebas de Papanicolaou normales consecutivas. Las pruebas pueden interrumpirse [employer] 49 y los 62 aos, si tiene tres pruebas de Papanicolaou normales consecutivas y ninguna prueba de Papanicolaou ni de VPH anormal en los ltimos 10 aos.  Prueba de sangre oculta en materia fecal. Carma Lair ao a partir de los 50 [employer] 75 aos. No tendr que hacerlo si se realiza una colonoscopa cada 10 aos.  Colonoscopa o sigmoidoscopa flexible.Marland KitchenCarma Lair 5 aos para la sigmoidoscopa flexible o cada 10 aos para la colonoscopa, a Proofreader de los 49 aos de edad y [place] 18 aos.  Anlisis de sangre para la  hepatitis C.**/Para todas las personas nacidas entre 1945 y 1965, y a todo aquel que tenga un riesgo conocido de haber contrado esta enfermedad.  Pruebas de deteccin de osteoporosis.Marland KitchenWesley Blas nica vez en mujeres de ms de 74 aos que tengan riesgo de fracturas u osteoporosis.  Autoexamen de piel /todos los meses  [place] antigripal. [place] los aos  Vacuna contra la difteria, ttanos y Advice worker (DTPa/DT).**/Una dosis de DT cada 10 aos.  Vacuna contra la varicela.**Cecille Amsterdam a su mdico.  Vacuna contra el herpes zoster.Marland KitchenArdelia Mems dosis para adultos de 60 aos o ms.  Vacuna antineumoccica conjugada 13 valente (PCV13).Marland KitchenCecille Amsterdam a su mdico.  Vacuna antineumoccica de polisacridos (PPSV23).Marland KitchenArdelia Mems dosis para todos los adultos de 65 aos o ms.  Vacuna antimeningoccica.Marland KitchenCecille Amsterdam a su mdico.  Investment banker, operational contra la hepatitis A.**/Consulte a su mdico.  Vacuna contra la hepatitis B.**/Consulte a su mdico.  Vacuna antihaemophilus influenzae tipo B (Hib).**/Consulte a su mdico. ** Los antecedentes familiares y personales de riesgos y enfermedades pueden Quarry manager las recomendaciones del mdico. Document Released: 03/01/2005 Document Revised: 05/27/2013 ExitCare Patient Information 2015 [place], [place]. This information is not intended to replace advice given to you by your health care provider. Make sure you discuss any questions you have with your health care provider.

## 2014-10-29 LAB — CYTOLOGY - PAP

## 2014-11-05 ENCOUNTER — Encounter: Payer: Self-pay | Admitting: Obstetrics & Gynecology

## 2014-11-05 ENCOUNTER — Ambulatory Visit (INDEPENDENT_AMBULATORY_CARE_PROVIDER_SITE_OTHER): Payer: BLUE CROSS/BLUE SHIELD | Admitting: Obstetrics & Gynecology

## 2014-11-05 VITALS — BP 103/68 | HR 67 | Wt 122.0 lb

## 2014-11-05 DIAGNOSIS — Z30018 Encounter for initial prescription of other contraceptives: Secondary | ICD-10-CM | POA: Diagnosis not present

## 2014-11-05 DIAGNOSIS — Z30432 Encounter for removal of intrauterine contraceptive device: Secondary | ICD-10-CM

## 2014-11-05 DIAGNOSIS — Z975 Presence of (intrauterine) contraceptive device: Secondary | ICD-10-CM

## 2014-11-05 NOTE — Progress Notes (Signed)
   Subjective:    Patient ID: Aura DialsGuadalupe Plancarte Abeja, female    DOB: 17-Jul-1985, 29 y.o.   MRN: [id number]  HPI  This 29 yo H P2 is here to have her 454 1/28 yo Mirena replaced with a Nexplanon. She understands that irregular bleeding is common.  Review of Systems  Her recent pap showed LGSIL    Objective:   Physical Exam  Consent was signed. Time out procedure was done. Her right arm (her request) was prepped with betadine and infiltrated with 3 cc of 1% lidocaine. After adequate anesthesia was assured, the Nexplanon device was placed according to standard of care. Her arm was hemostatic and was bandaged. She tolerated the procedure well.  I then cleaned her cervix with betadine and used a uterine dressing forcep to easily remove her intact Mirena IUD.       Assessment & Plan:  Contraception- Nexplanon RTC for colposcopy and cervical cultures

## 2014-11-10 ENCOUNTER — Ambulatory Visit (INDEPENDENT_AMBULATORY_CARE_PROVIDER_SITE_OTHER): Payer: BLUE CROSS/BLUE SHIELD | Admitting: Obstetrics & Gynecology

## 2014-11-10 ENCOUNTER — Encounter: Payer: Self-pay | Admitting: Obstetrics & Gynecology

## 2014-11-10 VITALS — BP 105/58 | HR 63 | Wt 121.0 lb

## 2014-11-10 DIAGNOSIS — Z01812 Encounter for preprocedural laboratory examination: Secondary | ICD-10-CM | POA: Diagnosis not present

## 2014-11-10 DIAGNOSIS — Z789 Other specified health status: Secondary | ICD-10-CM | POA: Insufficient documentation

## 2014-11-10 DIAGNOSIS — R87612 Low grade squamous intraepithelial lesion on cytologic smear of cervix (LGSIL): Secondary | ICD-10-CM | POA: Diagnosis not present

## 2014-11-10 HISTORY — PX: COLPOSCOPY W/ BIOPSY / CURETTAGE: SUR283

## 2014-11-10 LAB — POCT URINE PREGNANCY: PREG TEST UR: NEGATIVE

## 2014-11-10 NOTE — Progress Notes (Signed)
    GYNECOLOGY CLINIC COLPOSCOPY PROCEDURE NOTE  29 y.o. G2P2 here for colposcopy for low-grade squamous intraepithelial neoplasia (LGSIL - encompassing HPV,mild dysplasia,CIN I) pap smear on 10/27/14. Patient is Spanish-speaking only, Spanish interpreter present for this encounter. Discussed role for HPV in cervical dysplasia, need for surveillance.  Patient given informed consent, signed copy in the chart, time out was performed.  Placed in lithotomy position. Cervix viewed with speculum and colposcope after application of acetic acid.   Colposcopy adequate? Yes Acet[place]ite lesion(s) noted at 3 and 6 o'clock; corresponding biopsies obtained.  ECC specimen obtained. All specimens were labelled and sent to pathology.  Patient was given post procedure instructions.  Will follow up pathology and manage accordingly.  Routine preventative health maintenance measures emphasized.   Jaynie CollinsUGONNA  Talya Quain, MD, FACOG Attending Obstetrician & Gynecologist [facility], [facility]

## 2014-11-10 NOTE — Patient Instructions (Signed)
Colposcopa - Cuidados posteriores (Colposcopy, Care After) Siga estas instrucciones durante las prximas semanas. Estas indicaciones le proporcionan informacin general acerca de cmo deber cuidarse despus del procedimiento. El mdico tambin podr darle instrucciones ms especficas. El tratamiento se ha planificado de acuerdo a las prcticas mdicas actuales, pero a veces se producen problemas. Comunquese con el mdico si tiene algn problema o tiene dudas despus del procedimiento. QU ESPERAR DESPUS DEL PROCEDIMIENTO  Despus del procedimiento, es tpico tener las siguientes sensaciones:  Clicos. Generalmente se calman en algunos minutos.  Dolor. Puede durar [facility].  Aturdimiento. Si esto le ocurre, recustese durante algunos minutos. Podr tener un sangrado leve o una secrecin oscura que debe detenerse en [place]. Durante este tiempo deber usar un apsito sanitario. INSTRUCCIONES PARA EL CUIDADO EN EL HOGAR  Evite las [address], las duchas vaginales y el uso de tampones durante [address]  [address], o segn lo que le indique su mdico.  Tome slo medicamentos de venta libre o recetados, segn las indicaciones del mdico. No tome aspirina, ya que puede causar hemorragias.  Si utiliza pldoras anticonceptivas, contine tomndolas.  No todos los resultados estarn disponibles durante su visita. En este caso, tenga otra entrevista con su mdico para conocerlos. No suponga que es normal si no tiene noticias de su mdico o del establecimiento de salud. Es Copy seguimiento de todos los [place].  Siga los consejos de su mdico con respecto a los [place], [place], visitas y Papanicolau de control. SOLICITE ATENCIN MDICA SI:  Aparece una erupcin cutnea.  Tiene problemas con los medicamentos. SOLICITE ATENCIN MDICA DE INMEDIATO SI:  Tiene una hemorragia abundante o elimina cogulos.  Tiene fiebre.  Tiene flujo vaginal  anormal.  Tiene clicos que no se alivian luego de tomar analgsicos.  Se siente mareada, tiene vahdos o se desmaya.  Siente Physiological scientist. Document Released: 03/12/2013 [facility] Patient Information 2015 [place], [place]. This information is not intended to replace advice given to you by your health care provider. Make sure you discuss any questions you have with your health care provider.  Virus del papiloma humano (Human Papillomavirus) El virus del Geneticist, molecular (VPH) es la enfermedad de transmisin sexual (ETS) ms comn y es altamente contagiosa. Las infecciones por el virus del VPH causan verrugas y cncer en la parte externa del tero (el cuello), el canal del parto(vagina), la abertura del canal de parto (vulva), y el ano. Hay ms de 100 tipos de VPH. Cuatro tipos de VPH son los responsables de Data processing manager el 70% del cncer cervical. El noventa por ciento del cncer de la zona anal y las verrugas genitales se deben al virus VPH. A menos que tenga verrugas genitales que pueda ver o tocar, el VPH no ocasiona sntomas. Por lo tanto, las personas pueden estar infectadas por largos perodos de Hepburn y pasarlo a otras sin saberlo.  El VPH en el embarazo no suele causar problemas ni a la madre ni al beb. Si la madre tiene verrugas genitales, el beb rara vez se infecta. Cuando la infeccin por VPH parece ser precancerosa en el cuello del tero, la vagina o la vulva, la madre deber ser controlada cercanamente durante el [place]. Cualquier tratamiento necesario se realizar despus de nacido el beb. CAUSAS  Tener sexo sin proteccin Puede transmitirse practicando sexo vaginal, anal u oral.  Tener mltiples compaeros sexuales.  Tener un compaero sexual que tiene otros International aid/development worker.  Tener o haber tenido otras enfermedades de transmisin sexual. [place]  Mas del 90% de las personas que tienen VPH no pueden decir que tienen algn problema.  Verrugas como llagas en la  garganta (por tener sexo oral).  Verrugas en la piel infectada o membranas mucosas.  Las Magazine features editorverrugas genitales pueden picar, arder o Geophysicist/field seismologistsangrar.  Las Civil Service fast streamerverrugas genitales pueden ser dolorosas o Geophysicist/field seismologistsangrar durante las relaciones sexuales. DIAGNSTICO  Las verrugas genitales pueden observarse a simple vista para el anlisis diagnstico.  Actualmente no hay anlisis aprobados para Administrator, Civil Servicedetectar el VPH en los hombres.  En las mujeres, el Papanicolau puede detectar clulas infectadas con el VPH.  Se utiliza un dispositivo para ver el cuello del tero (colposcopa). La colposcopa se indica si el examen plvico o el Papanicolau no son normales.  Durante la colposcopa se saca una muestra de tejido (biopsia, TRATAMIENTO  Las verrugas genitales pueden tratarse con:  Podofilina, una aplicacin de una pasta a las Public affairs consultantverrugas.  Acido bicloroactico o tricloroactico en forma lquida.  Podofilox solucin o gel.  Imiquimod crema  Inyeccin de interfern.  Crioterapia, congelamiento de verrugas.  Tratamiento lser a las Public affairs consultantverrugas.  Electrocauterizacin para Medical sales representativedestruir las verrugas.  Remocin quirrgica de [employer] verrugas.  La infeccin en el crvix, la vagina o la vulva pueden tratarse con:  Crioterapia.  Lser.  Electrocauterizacin.  Remocin Barbadosquirrgica. El profesional que lo asiste lo controlar peridicamente una vez comenzado el Garcenotratamiento. Esto se debe a que Microbiologistel VPH puede reaparecer y podra tener que tratarse nuevamente. INSTRUCCIONES PARA EL CUIDADO DOMICILIARIO  Siga las instrucciones del profesional que lo asiste con respecto al uso de medicamentos, Papanicolau y exmenes de seguimiento.  No toque o rasque las verrugas.  No intente tratar las verrugas genitales con el mismo medicamento que se Coc[place] ([place]) Islandsutiliza para las verrugas de las manos.  Comente con su compaero sexual acerca de su infeccin porque podra tambin Administratornecesitar un tratamiento.  No tenga relaciones sexuales mientras recibe el  tratamiento.  Luego del tratamiento, utilice condones durante las relaciones sexuales para prevenir la reinfeccin.  Janie Morningenga un solo compaero sexual.  Janie Morningenga un compaero sexual que no tiene otros International aid/development workercompaeros sexuales.  Puede utilizar cremas de venta libre para la picazn o irritacin. Consulte con el profesional que lo asiste antes de utilizarlos.  Slo tome medicamentos de Sales promotion account executiveventa libre o prescriptos para Primary school teachercalmar el dolor, las molestias o bajar la fiebre segn las indicaciones de su mdico.  No se haga duchas vaginales ni utilice tampones durante el tratamiento del VPH. PREVENCIN  Converse con su mdico acerca de aplicarse la vacuna contra el VPH. Estas vacunas evitan las infecciones por el VPH y Management consultantel cncer. Se recomienda que la vacuna se aplique a varones y Lexmark Internationalmujeres entre los 9 y los [address] aos. No tendr efecto si ya tiene el VPH y no se recomienda en mujeres embarazadas. Estas vacunas no se recomiendan en mujeres embarazadas.  Comunquese con el mdico si piensa que que est embarazada y [place]ene el VPH.  Un Papanicolau se realiza para Consulting civil engineerdiagnosticar cncer cervical.  El primer Papanicolaou debe realizarse los 21 aos.  DynegyEntre los 21 y los 29 aos debe repetirse [address] dos aos.  Luego de los 30 aos, debe realizarse un Papanicolaou cada tres aos siempre que los 3 estudios anteriores sean normales.  Algunas mujeres sufren problemas mdicos que aumentan la probabilidad de Research officer, political partycontraer cncer cervical. Consulte a su mdico acerca de estos problemas. Es muy importante que le informe a su mdico si aparecen nuevos problemas poco despus de su ltimo Papanicolaou. En estos casos, el mdico podr News Corporationindicar que  se realice el Papanicolaou con ms frecuencia.  Estas recomendaciones son las mismas para todas las mujeres hayan recibido o no la vacuna para el VPH (virus del papiloma humano).  Si le han realizado una histerectoma por un problema que no era cncer u otra enfermedad que podra causar cncer, ya no  necesitar un Papanicolaou. Sin embargo, aunque ya no necesite un Papanicolau, es una buena idea Medical sales representative examen regular para asegurarse de que no hay otros problemas.  Si tiene entre 65 y 65 aos y ha tenido un Papanicolaou normal en los ltimos 10 aos, ya no ser Music therapist. Sin embargo, aunque ya no necesite un Papanicolau, es una buena idea Medical sales representative examen regular para asegurarse de que no hay otros problemas.   Si ha recibido un tratamiento para Management consultant cervical una enfermedad podra causar cncer, necesitar realizar un Papanicolaou y controles durante al menos 20 aos de concluir el tratamiento  Si no ha tenido continuidad para [employer] Papanicolau, debern evaluarse nuevamente los factores de riesgo (como tener una nueva pareja sexual) para Chief Strategy Officer si deben [employer].  Algunas mujeres necesitarn realizarse estudios con ms frecuencia si tienen factores de riesgo para Management consultant cervical. SOLICITE ANTENCIN MDICA SI:  La piel de la zona tratada se vuelve roja, se hincha o duele.  La temperatura se eleva por encima de 38,9 C (102 F).  Siente un Engineer, maintenance (IT).  Siente bultos o protuberancias tipo granos en la zona genital.  Desarrolla una hemorragia vaginal o en la zona de tratamiento.  Tiene dolor durante las [employer]. Document Released: 09/07/2008 Document Revised: 08/14/2011 [facility] Patient Information 2015 [place], [place]. This information is not intended to replace advice given to you by your health care provider. Make sure you discuss any questions you have with your health care provider.

## 2014-11-11 ENCOUNTER — Encounter: Payer: Self-pay | Admitting: Obstetrics & Gynecology

## 2014-11-11 DIAGNOSIS — N871 Moderate cervical dysplasia: Secondary | ICD-10-CM | POA: Insufficient documentation

## 2014-11-11 HISTORY — DX: Moderate cervical dysplasia: N87.1

## 2014-12-10 ENCOUNTER — Ambulatory Visit (INDEPENDENT_AMBULATORY_CARE_PROVIDER_SITE_OTHER): Payer: BLUE CROSS/BLUE SHIELD | Admitting: Obstetrics & Gynecology

## 2014-12-10 ENCOUNTER — Encounter: Payer: Self-pay | Admitting: Obstetrics & Gynecology

## 2014-12-10 VITALS — BP 101/66 | HR 65 | Ht 66.0 in | Wt 118.0 lb

## 2014-12-10 DIAGNOSIS — Z01812 Encounter for preprocedural laboratory examination: Secondary | ICD-10-CM

## 2014-12-10 DIAGNOSIS — N871 Moderate cervical dysplasia: Secondary | ICD-10-CM

## 2014-12-10 HISTORY — PX: GYNECOLOGIC CRYOSURGERY: SHX857

## 2014-12-10 LAB — POCT URINE PREGNANCY: Preg Test, Ur: NEGATIVE

## 2014-12-10 NOTE — Progress Notes (Signed)
    GYNECOLOGY CLINIC PROCEDURE NOTE  Cryotherapy details  Indication: CIN II pathology after colposcopy on 11/10/14 after LGSIL pap on 10/27/14  The indications for cryotherapy were reviewed with the patient in detail. She was counseled about that efficacy of this procedure, and possible need for excisional procedure in the future if her cervical dysplasia persists.  The risks of the procedure where explained in detail and patient was told to expect a copious amount of discharge in the next few weeks. All her questions were answered, and written informed consent was obtained.  The patient was placed in the dorsal lithotomy position and a vaginal speculum was placed. Her cervix was visualized and patient was noted to have had normal size transformation zone. The appropriate cryotherapy probe was picked and affixed to cryotherapy apparatus. Then nitrogen gas was then activated, the probe was coated with lubricating jelly and applied to the transformation zone of the cervix. This was kept in place for 3 minutes. The cryotherapy was then stopped and all instruments were removed from the patient's pelvis; a thawing period of 3 minutes was observed.  A second cycle of cryotherapy was then administered to the cervix for 3 minutes.  The patient tolerated the procedure well without any complications. Routine post procedure instructions were given to the patient.  Will repeat pap smear in 6 months and manage accordingly.   Jaynie CollinsUGONNA  ANYANWU, MD, FACOG Attending Obstetrician & Gynecologist Center for [facility], [facility]

## 2014-12-10 NOTE — Patient Instructions (Signed)
Cervical cryotherapy is a procedure which involves freezing an area of abnormal tissue on the cervix. This tissue gradually disappears and the cervix heals. One cervical cryotherapy is usually sufficient to destroy the abnormal tissue.  Purpose  Cervical cryotherapy is a standard method used to treat cervical dysplasia, meaning the removal of abnormal cell tissue on the cervix.  Description  Cervical cryotherapy, or freezing, usually lasts about five minutes and causes a slight amount of discomfort. The procedure is usually performed in an outpatient setting.  Cervical cryotherapy is done by placing a small freeze-probe (cryoprobe) against the cervix that cools the cervix to sub-zero temperatures. The cells destroyed by freezing are shed afterwards in a heavy watery discharge. The main advantage of cryotherapy is that it is a simple procedure that requires inexpensive equipment.  The cryogenic device consists of a gas tank containing a refrigerant and non-explosive, non-toxic gas (usually nitrous oxide). The gas is delivered using flexible tubing through a gun-type attachment to the cryoprobe.  Diagnosis/Preparation  Women who undergo cervical cryotherapy typically have had an abnormal Pap smear which has led to a diagnosis of cervical squamous dysplasia and usually confirmed by biopsy after an adequate colposcopic exam.  Preparation for cervical cryotherapy involves scheduling the procedure when the patient is not experiencing heavy menstrual flow. Ibuprofen or naproxen sodium may be given before cryotherapy to decrease cramping. If there is any doubt about the pregnancy status, a pregnancy test is performed.  Aftercare  Cervical cryotherapy is often followed by a heavy and often odorous discharge during the first month after the procedure. The discharge is due to the dead tissue cells leaving the treatment site. The patient should abstain from sexual intercourse and not use tampons for a period of two  weeks after the procedure. Excessive exercise should also be avoided to lessen the occurrence of post-therapy bleeding.  Risks  The following risks have been associated with cervical cryotherapy:  Uterine cramping. Often occurs during the cryotherapy but rapidly subsides after treatment.  Bleeding and infection. Rare, but incidences have been reported.  More difficult Pap smears. Future Pap smears and colposcopy may be more difficult after cryotherapy.  Normal results  A normal result is no recurrence of the abnormal cervix cells. The first follow-up Pap smear is done at 12 months then 24 months after the procedure. If normal, patients resume usual pap smear screening.  If any, recurrences usually occur within two years of treatment.  If a follow-up Pap smear is abnormal, a colposcopy with biopsy is usually performed. Other treatment methods, usually the loop electrocautery excision procedure (LEEP) are then used if persistent disease is discovered.  Following the procedure, it is considered normal to experience the following:  slight cramping for two to three days  watery discharge requiring several pad changes daily  Bloody or brown discharge, especially 12-16 days after the procedure  Alternatives  Loop electrocautery excision procedure (LEEP). This procedure uses a fine wire loop with an electric current flowing through it to remove the desired area of the cervix. Loop excision is usually done under local anesthesia and causes very little discomfort.     

## 2015-06-22 ENCOUNTER — Encounter: Payer: Self-pay | Admitting: Obstetrics & Gynecology

## 2015-06-22 ENCOUNTER — Ambulatory Visit (INDEPENDENT_AMBULATORY_CARE_PROVIDER_SITE_OTHER): Payer: BLUE CROSS/BLUE SHIELD | Admitting: Obstetrics & Gynecology

## 2015-06-22 VITALS — BP 99/63 | HR 75 | Resp 18 | Wt 120.0 lb

## 2015-06-22 DIAGNOSIS — N871 Moderate cervical dysplasia: Secondary | ICD-10-CM

## 2015-06-22 DIAGNOSIS — Z124 Encounter for screening for malignant neoplasm of cervix: Secondary | ICD-10-CM

## 2015-06-22 DIAGNOSIS — Z1151 Encounter for screening for human papillomavirus (HPV): Secondary | ICD-10-CM | POA: Diagnosis not present

## 2015-06-22 NOTE — Patient Instructions (Signed)
Regrese a la clinica cuando tenga su cita. Si tiene problemas o preguntas, llama a la clinica o vaya a la sala de emergencia al [facility].    

## 2015-06-22 NOTE — Progress Notes (Signed)
   CLINIC ENCOUNTER NOTE  History:  30 y.o. G2P2 here today for repeat pap smear after undergoing cryotherapy on 12/10/2014.  She had CIN II pathology after colposcopy on 11/10/14 after LGSIL pap on 10/27/14  She denies any abnormal vaginal discharge, bleeding, pelvic pain or other concerns.   No past medical history on file.  Past Surgical History  Procedure Laterality Date  . Colposcopy w/ biopsy / curettage  12/2014   The following portions of the patient's history were reviewed and updated as appropriate: allergies, current medications, past family history, past medical history, past social history, past surgical history and problem list.   Review of Systems:  Pertinent items noted in HPI and remainder of comprehensive ROS otherwise negative.  Objective:  Physical Exam BP 99/63 mmHg  Pulse 75  Resp 18  Wt 120 lb (54.432 kg) CONSTITUTIONAL: Well-developed, well-nourished female in no acute distress.  SKIN: Skin is warm and dry. No rash noted. Not diaphoretic. No erythema. No pallor. NEUROLGIC: Alert and oriented to person, place, and time. Normal reflexes, muscle tone coordination. No cranial nerve deficit noted. PSYCHIATRIC: Normal mood and affect. Normal behavior. Normal judgment and thought content. CARDIOVASCULAR: Normal heart rate noted RESPIRATORY: Effort and breath sounds normal, no problems with respiration noted ABDOMEN: Soft, no distention noted.   PELVIC: Normal appearing external genitalia; normal appearing vaginal mucosa and cervix. Pap smear obtained No abnormal discharge noted.  Normal uterine size, no other palpable masses, no uterine or adnexal tenderness. MUSCULOSKELETAL: Normal range of motion. No edema noted.  Assessment & Plan:  Moderate dysplasia of cervix (CIN II) - Cytology - PAP done today Will follow up results and manage accordingly. Routine preventative health maintenance measures emphasized. Please refer to After Visit Summary for other counseling  recommendations.    [name], MD, FACOG Attending Obstetrician & Gynecologist, [facility] and Center for [facility]

## 2015-06-24 LAB — CYTOLOGY - PAP

## 2015-06-26 ENCOUNTER — Encounter: Payer: Self-pay | Admitting: Obstetrics & Gynecology

## 2015-06-26 DIAGNOSIS — R87618 Other abnormal cytological findings on specimens from cervix uteri: Secondary | ICD-10-CM | POA: Insufficient documentation

## 2015-06-26 DIAGNOSIS — R8789 Other abnormal findings in specimens from female genital organs: Secondary | ICD-10-CM | POA: Insufficient documentation

## 2015-06-28 ENCOUNTER — Telehealth: Payer: Self-pay | Admitting: General Practice

## 2015-06-28 NOTE — Telephone Encounter (Signed)
Per Dr Macon Large, patient's pap was negative but was positive for HRHPV, but negative HPV 16 and 18/45. Will repeat cotesting in one year. Called patient with Crystal Graves for interpreter & informed her of results & recommendations. Patient verbalized understanding & had no questions

## 2015-09-05 IMAGING — CR DG FINGER MIDDLE 2+V*L*
3 series · 3 of 3 positions shown · non-contrast
Comparison: None.

CLINICAL DATA: Injured left middle finger.

EXAM:
LEFT MIDDLE FINGER 2+V

[x finger pa left]
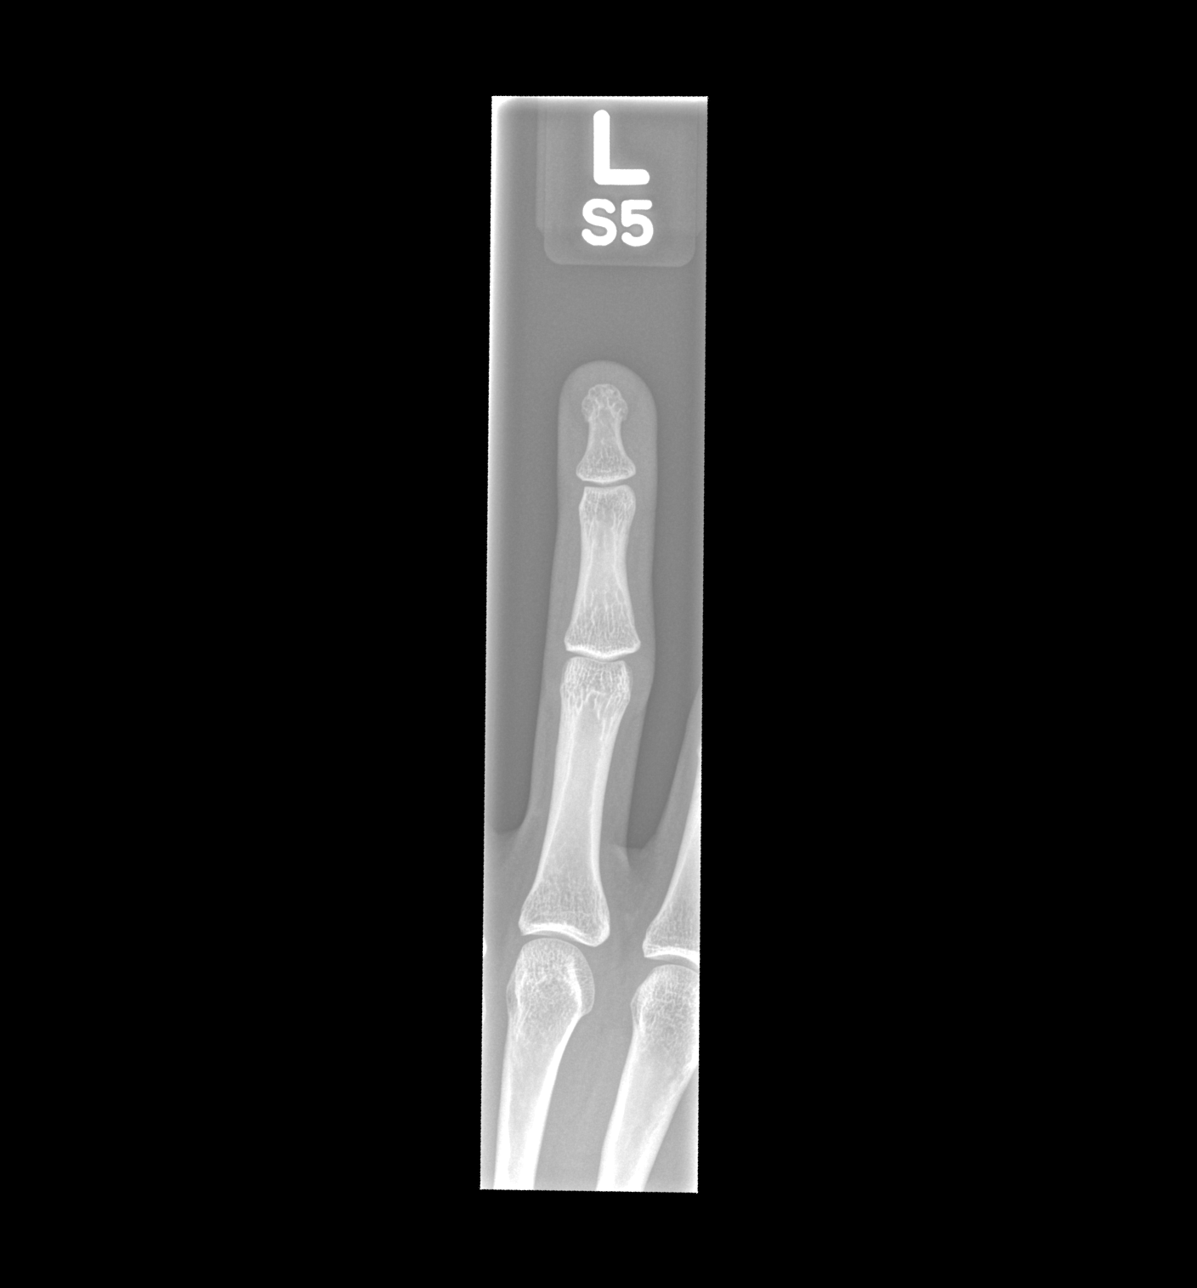

[x finger obl left]
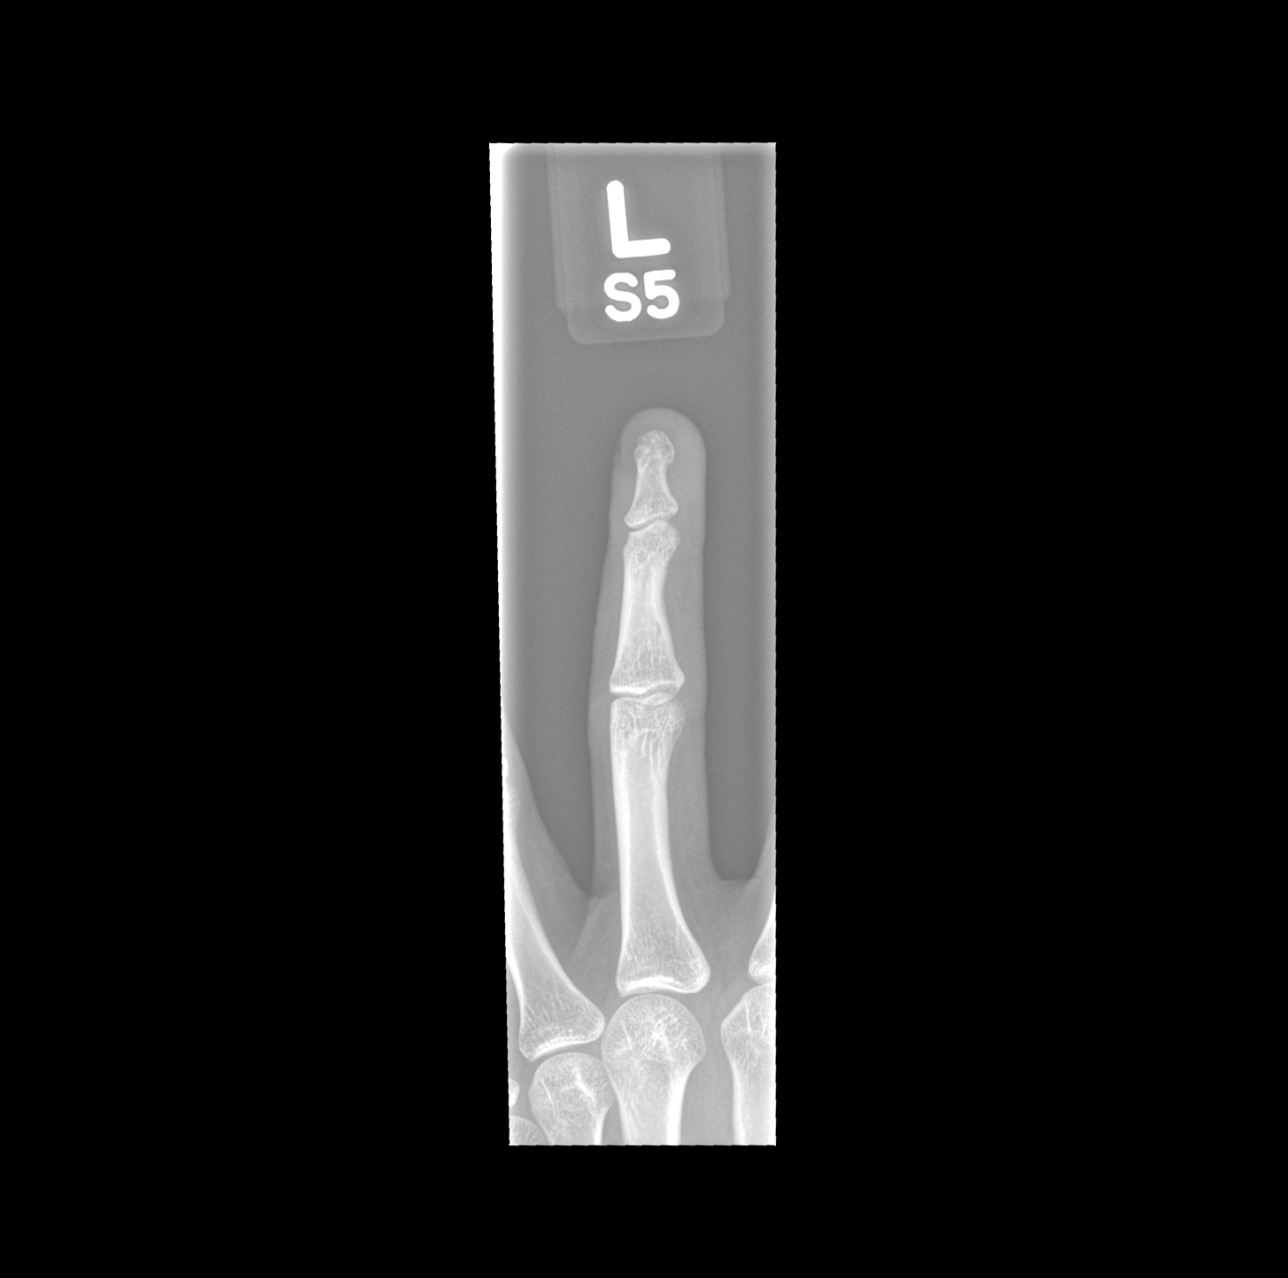

[x finger lat left]
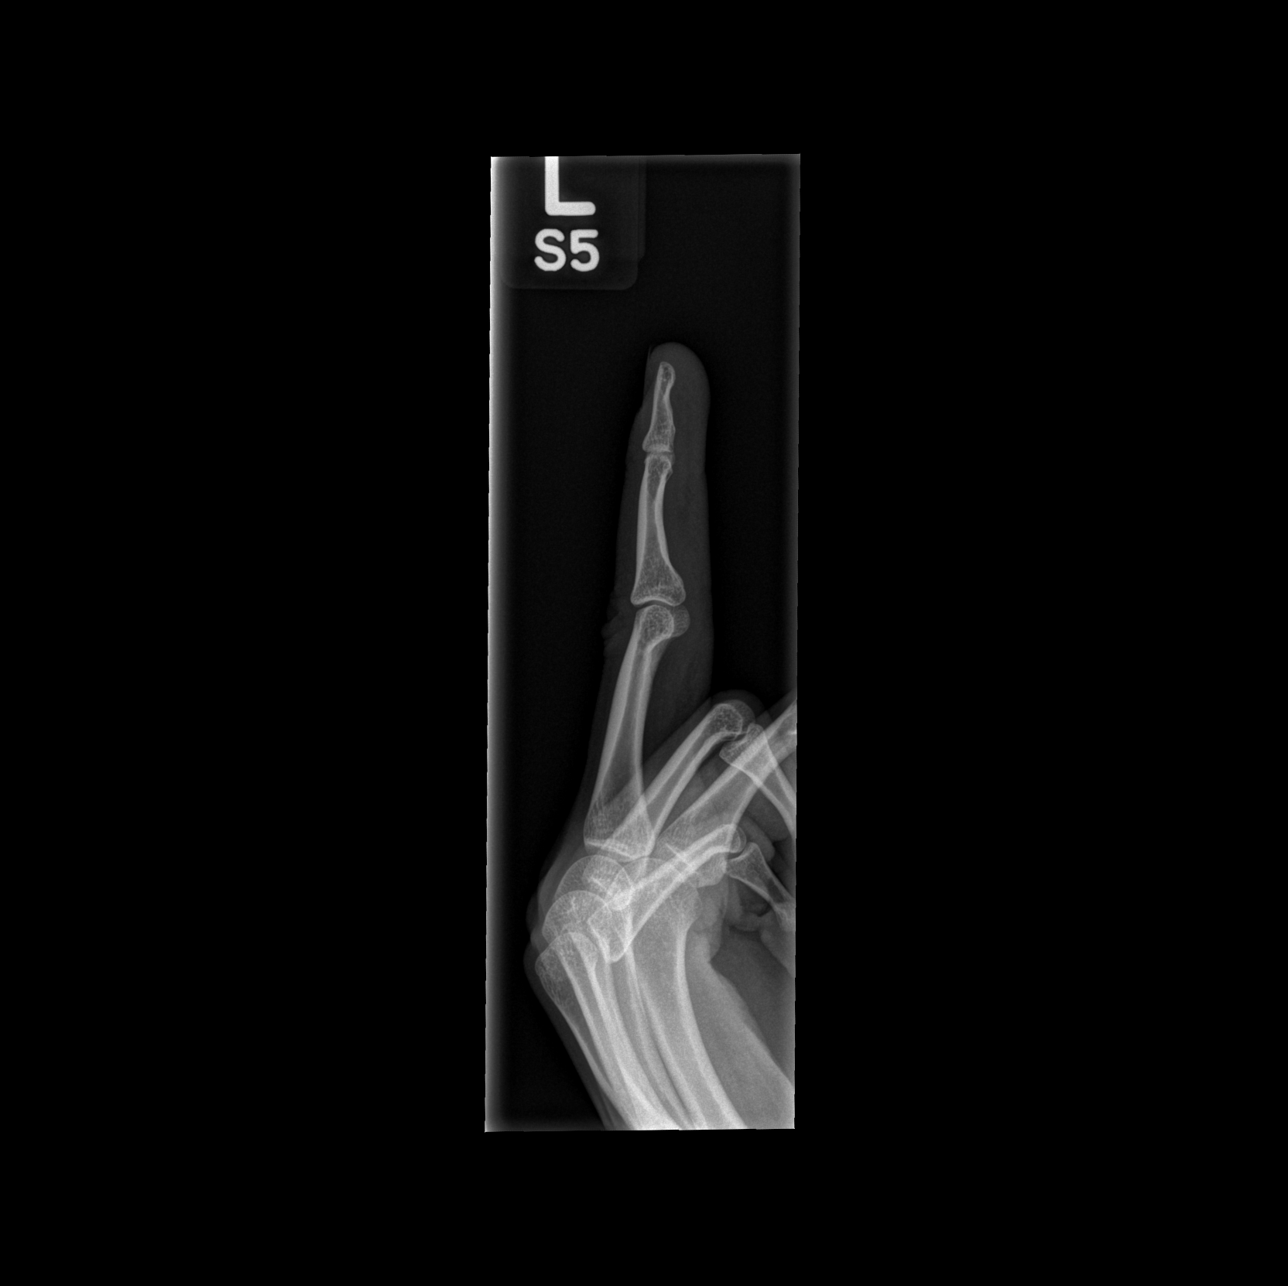

[3 of 3 positions shown; findings below may reference images not displayed]

FINDINGS: The joint spaces are maintained. No acute fracture or radiopaque
foreign body.
IMPRESSION: No acute bony findings.

## 2016-06-27 ENCOUNTER — Encounter: Payer: Self-pay | Admitting: Obstetrics & Gynecology

## 2016-06-27 ENCOUNTER — Ambulatory Visit (INDEPENDENT_AMBULATORY_CARE_PROVIDER_SITE_OTHER): Payer: BLUE CROSS/BLUE SHIELD | Admitting: Obstetrics & Gynecology

## 2016-06-27 VITALS — BP 102/68 | HR 67 | Ht 66.0 in | Wt 113.0 lb

## 2016-06-27 DIAGNOSIS — R87618 Other abnormal cytological findings on specimens from cervix uteri: Secondary | ICD-10-CM

## 2016-06-27 DIAGNOSIS — Z0000 Encounter for general adult medical examination without abnormal findings: Secondary | ICD-10-CM | POA: Diagnosis not present

## 2016-06-27 DIAGNOSIS — R87610 Atypical squamous cells of undetermined significance on cytologic smear of cervix (ASC-US): Secondary | ICD-10-CM | POA: Insufficient documentation

## 2016-06-27 DIAGNOSIS — R8789 Other abnormal findings in specimens from female genital organs: Secondary | ICD-10-CM

## 2016-06-27 DIAGNOSIS — Z01419 Encounter for gynecological examination (general) (routine) without abnormal findings: Secondary | ICD-10-CM | POA: Diagnosis not present

## 2016-06-27 DIAGNOSIS — R87810 Cervical high risk human papillomavirus (HPV) DNA test positive: Principal | ICD-10-CM

## 2016-06-27 DIAGNOSIS — R42 Dizziness and giddiness: Secondary | ICD-10-CM

## 2016-06-27 DIAGNOSIS — R634 Abnormal weight loss: Secondary | ICD-10-CM

## 2016-06-27 NOTE — Progress Notes (Signed)
Pt here for annual gyn. She would like referral to PCP to evaluate one occurrence of dizziness last Thursday. She has lost 7 pounds without trying to lose weight.

## 2016-06-27 NOTE — Patient Instructions (Signed)
Regrese a la clinica cuando tenga su cita. Si tiene problemas o preguntas, llama a la clinica o vaya a la sala de [place].     Cuidados preventivos en las mujeres de entre 90 y 53aos (Preventive Care 18-39 Years, Female) Los cuidados preventivos hacen referencia a las opciones en cuanto al estilo de vida y a las visitas al mdico, las cuales pueden promover la salud y Musician. QU INCLUYEN LOS CUIDADOS PREVENTIVOS?  Un examen fsico anual. Tambin se lo conoce como control de rutina anual.  Exmenes dentales una o dos veces al ao.  Exmenes oculares de [place]. Pregntele al mdico con qu frecuencia debe hacerse controles oculares.  Opciones personales en cuanto al estilo de vida, entre ellas:  [place] y las piezas dentales.  Realizar actividad fsica con regularidad.  Consumir una dieta saludable.  Evitar el consumo de tabaco y de drogas.  Limitar el consumo de bebidas alcohlicas.  Counsellor.  Tomar los suplementos vitamnicos y [employer] se lo haya indicado el mdico. QU SUCEDE DURANTE UN CONTROL DE [place]?  Los servicios y las pruebas de deteccin que el mdico realiza durante un control de rutina anual dependern de la edad, el estado de salud general, los factores de riesgo relacionados con el estilo de vida y los antecedentes familiares de enfermedades. Psicoterapia  El mdico puede hacerle preguntas sobre lo siguiente:  Consumo de alcohol.  Consumo de tabaco.  Consumo de drogas.  [place] familiar y en las relaciones.  Actividad sexual.  Hbitos de alimentacin.  Trabajo y entorno laboral.  Mtodos anticonceptivos.  El ciclo menstrual.  Antecedentes de embarazos. Pruebas de deteccin  Pueden hacerle las siguientes pruebas o mediciones:  Estatura, peso e ndice de masa corporal [facility]).  Pruebas de deteccin de la diabetes. Para realizarlas, se  controla el nivel de azcar en la sangre (glucemia) despus de que haya pasado un rato sin comer (ayuno).  La presin arterial.  Niveles de lpidos y colesterol. Se pueden controlar cada 4aos, a partir de los [place].  Controles de la piel.  Anlisis de sangre de hepatitisC.  Anlisis de sangre de hepatitisB.  Pruebas de deteccin de enfermedades de transmisin sexual (ETS).  Pruebas de deteccin de tumores malignos relacionados con el BRCA. Se pueden realizar si tiene antecedentes familiares de cncer de mama, de ovarios, de trompas o de peritoneo.  Examen plvico y prueba de Papanicolaou. Se pueden realizar cada 3aos, a partir de los 21aos. A partir de los 30aos, se pueden realizar cada 5aos si le hacen una prueba de Papanicolaou en combinacin con un anlisis del virus del Engineer, technical sales (VPH). Hable con el mdico [employer], las opciones de tratamiento y, si es necesario, la necesidad de Optometrist ms [place]. Vacunas  El mdico puede recomendarle que se aplique algunas vacunas, por ejemplo:  [place] antigripal. Se recomienda su aplicacin todos los aos.  Vacuna contra la difteria, el ttanos y Research officer, trade union (Tdap, Td). Puede que tenga que aplicarse un refuerzo contra el ttanos y la difteria (Td) cada 10aos.  Vacuna contra la varicela. Es posible que tenga que aplicrsela si no recibi esta vacuna.  Lucile [place] el VPH. Si es menor de 26aos, puede que necesite aplicarse tres dosis a lo largo de 47mses.  Vacuna contra el sarampin, la rubola y las paperas ([place]. Tendr que aplicarse por lo menos una dosis de la SRP. Podra tambin necesitar  una segunda dosis.  Vacuna antineumoccica conjugada 13valente (PCV13). Puede necesitar esta vacuna si tiene determinadas enfermedades y no se vacun anteriormente.  Vacuna antineumoccica de polisacridos (PPSV23). Quizs tenga que aplicarse una o dos dosis si fuma o si sufre determinadas  enfermedades.  Vacuna antimeningoccica. Se recomienda la aplicacin de Ardelia Mems dosis si tiene entre 19 y 21aos, y es estudiante universitaria de Psychologist, educational ao que vive en una residencia estudiantil, o si tiene una de varias enfermedades. Podra tambin necesitar dosis de refuerzo.  Vacuna contra la hepatitis A. Es posible que tenga que aplicarse esta vacuna si tiene determinadas enfermedades, o si trabaja en lugares donde puede estar expuesta a la hepatitisA o viaja a estas zonas.  Vacuna contra la hepatitis B. Es posible que tenga que aplicarse esta vacuna si tiene determinadas enfermedades, o si trabaja en lugares donde puede estar expuesta a la hepatitisB o viaja a estas zonas.  Vacuna antihaemophilus influenzae tipoB (Hib). Tal vez deba aplicarse esta vacuna si tiene determinados factores de [place]. Hable con el [place] deteccin y las vacunas que tiene que [place], y con qu frecuencia debe hacerlo. Esta informacin no tiene Marine scientist el consejo del mdico. Asegrese de hacerle al mdico cualquier pregunta que tenga. Document Released: 03/01/2005 Document Revised: 06/12/2014 Document Reviewed: 03/23/2015 Elsevier Interactive Patient Education  2017 [employer].

## 2016-06-27 NOTE — Progress Notes (Signed)
GYNECOLOGY ANNUAL PREVENTATIVE CARE ENCOUNTER NOTE  Subjective:   Crystal Graves is a 31 y.o. [phone] female here for a routine annual gynecologic exam. Patient is Spanish-speaking only, Spanish interpreter present for this encounter.  Current complaints: one episode of dizziness one week ago. Also has recent 7 pound weight loss without trying. No other pain anywhere, no other systemic symptoms.   Denies abnormal vaginal bleeding, discharge, pelvic pain, problems with intercourse or other gynecologic concerns.    Gynecologic History No LMP recorded. Patient has had an implant. Contraception: Nexplanon in right forearm. No problems reported.  Normal cytology on pap smear with positive HRHPV, but negative HPV 16 and 18/45 on 06/22/15; LGSIL pap in 2016, followed by colposcopy that showed CIN II -> cryosurgery.  Obstetric History OB History  Gravida Para Term Preterm AB Living  2 2 2  0 0 2  SAB TAB Ectopic Multiple Live Births  0 0 0 0 2    # Outcome Date GA Lbr Len/2nd Weight Sex Delivery Anes PTL Lv  2 Term      Vag-Spont   LIV  1 Term      Vag-Spont   LIV      History reviewed. No pertinent past medical history.  Past Surgical History:  Procedure Laterality Date  . COLPOSCOPY W/ BIOPSY / CURETTAGE  11/10/2014   Done for LGSIL pap smear  . GYNECOLOGIC CRYOSURGERY  12/10/2014   Done for CIN II (colposcopy pathology after LGSIL pap)    Current Outpatient Prescriptions on File Prior to Visit  Medication Sig Dispense Refill  . etonogestrel (NEXPLANON) 68 MG IMPL implant 1 each by Subdermal route once.     No current facility-administered medications on file prior to visit.     No Known Allergies  Social History   Social History  . Marital status: Single    Spouse name: N/A  . Number of children: N/A  . Years of education: N/A   Occupational History  . Not on file.   Social History Main Topics  . Smoking status: Never Smoker  . Smokeless tobacco: Never  Used  . Alcohol use 0.0 oz/week     Comment: Occasional  . Drug use: No  . Sexual activity: Yes    Birth control/ protection: Implant   Other Topics Concern  . Not on file   Social History Narrative  . No narrative on file    Family History  Problem Relation Age of Onset  . Hypertension Father     The following portions of the patient's history were reviewed and updated as appropriate: allergies, current medications, past family history, past medical history, past social history, past surgical history and problem list.  Review of Systems Pertinent items noted in HPI and remainder of comprehensive ROS otherwise negative.   Objective:  BP 102/68   Pulse 67   Ht 5\' 6"  (1.676 m)   Wt 113 lb (51.3 kg)   BMI 18.24 kg/m  CONSTITUTIONAL: Well-developed, well-nourished female in no acute distress.  HENT:  Normocephalic, atraumatic, External right and left ear normal. Oropharynx is clear and moist EYES: Conjunctivae and EOM are normal. Pupils are equal, round, and reactive to light. No scleral icterus.  NECK: Normal range of motion, supple, no masses.  Normal thyroid.  SKIN: Skin is warm and dry. No rash noted. Not diaphoretic. No erythema. No pallor. NEUROLOGIC: Alert and oriented to person, place, and time. Normal reflexes, muscle tone coordination. No cranial nerve deficit noted. PSYCHIATRIC: Normal  mood and affect. Normal behavior. Normal judgment and thought content. CARDIOVASCULAR: Normal heart rate noted, regular rhythm RESPIRATORY: Clear to auscultation bilaterally. Effort and breath sounds normal, no problems with respiration noted. BREASTS: Symmetric in size. No masses, skin changes, nipple drainage, or lymphadenopathy. ABDOMEN: Soft, normal bowel sounds, no distention noted.  No tenderness, rebound or guarding.  PELVIC: Normal appearing external genitalia; normal appearing vaginal mucosa and cervix.  No abnormal discharge noted.  Pap smear obtained.  Normal uterine size, no  other palpable masses, no uterine or adnexal tenderness. MUSCULOSKELETAL: Normal range of motion. No tenderness.  No cyanosis, clubbing, or edema.  2+ distal pulses. Nexplanon palpated in right forearm.   Assessment and Plan:  1. Dizziness 2. Recent unexplained weight loss - [facility]   3. Encounter for gynecological examination with Papanicolaou smear of cervix 4. Normal cytology on pap smear with positive HRHPV, but negative HPV 16 and 18/45 on 06/22/15 - Cytology - PAP. Will follow up results of pap smear and manage accordingly. Routine preventative health maintenance measures emphasized. Please refer to After Visit Summary for other counseling recommendations.    Jaynie CollinsUGONNA  Idalee Foxworthy, MD, FACOG Attending Obstetrician & Gynecologist, [facility]

## 2016-07-03 LAB — CYTOLOGY - PAP
Diagnosis: UNDETERMINED — AB
HPV (WINDOPATH): DETECTED — AB
HPV 16/18/45 genotyping: NEGATIVE

## 2016-07-05 ENCOUNTER — Telehealth: Payer: Self-pay | Admitting: *Deleted

## 2016-07-05 NOTE — Telephone Encounter (Signed)
LM for pt to rtn call to go over pap smear results and to make an appt for colpo.

## 2016-07-10 ENCOUNTER — Ambulatory Visit (INDEPENDENT_AMBULATORY_CARE_PROVIDER_SITE_OTHER): Payer: BLUE CROSS/BLUE SHIELD | Admitting: Primary Care

## 2016-07-10 ENCOUNTER — Encounter: Payer: Self-pay | Admitting: Primary Care

## 2016-07-10 VITALS — BP 108/68 | HR 61 | Temp 98.1°F | Ht 66.0 in | Wt 112.8 lb

## 2016-07-10 DIAGNOSIS — N871 Moderate cervical dysplasia: Secondary | ICD-10-CM | POA: Diagnosis not present

## 2016-07-10 DIAGNOSIS — Z0001 Encounter for general adult medical examination with abnormal findings: Secondary | ICD-10-CM

## 2016-07-10 DIAGNOSIS — R634 Abnormal weight loss: Secondary | ICD-10-CM

## 2016-07-10 DIAGNOSIS — Z789 Other specified health status: Secondary | ICD-10-CM | POA: Diagnosis not present

## 2016-07-10 DIAGNOSIS — Z0000 Encounter for general adult medical examination without abnormal findings: Secondary | ICD-10-CM | POA: Insufficient documentation

## 2016-07-10 LAB — LIPID PANEL
CHOL/HDL RATIO: 2
CHOLESTEROL: 137 mg/dL (ref 0–200)
HDL: 59.2 mg/dL (ref >39.00)
LDL CALC: 63 mg/dL (ref 0–99)
NonHDL: 77.47
Triglycerides: 70 mg/dL (ref 0.0–149.0)
VLDL: 14 mg/dL (ref 0.0–40.0)

## 2016-07-10 LAB — CBC
HEMATOCRIT: 41.1 % (ref 36.0–46.0)
HEMOGLOBIN: 13.7 g/dL (ref 12.0–15.0)
MCHC: 33.4 g/dL (ref 30.0–36.0)
MCV: 90.9 fl (ref 78.0–100.0)
Platelets: 243 10*3/uL (ref 150.0–400.0)
RBC: 4.52 Mil/uL (ref 3.87–5.11)
RDW: 12.9 % (ref 11.5–15.5)
WBC: 4.8 10*3/uL (ref 4.0–10.5)

## 2016-07-10 LAB — COMPREHENSIVE METABOLIC PANEL
ALT: 15 U/L (ref 0–35)
AST: 17 U/L (ref 0–37)
Albumin: 4.4 g/dL (ref 3.5–5.2)
Alkaline Phosphatase: 59 U/L (ref 39–117)
BILIRUBIN TOTAL: 0.6 mg/dL (ref 0.2–1.2)
BUN: 15 mg/dL (ref 6–23)
CHLORIDE: 105 meq/L (ref 96–112)
CO2: 29 meq/L (ref 19–32)
CREATININE: 0.59 mg/dL (ref 0.40–1.20)
Calcium: 9.6 mg/dL (ref 8.4–10.5)
GFR: 127.94 mL/min (ref >60.00)
GLUCOSE: 103 mg/dL — AB (ref 70–99)
Potassium: 4 mEq/L (ref 3.5–5.1)
SODIUM: 139 meq/L (ref 135–145)
Total Protein: 7.5 g/dL (ref 6.0–8.3)

## 2016-07-10 LAB — TSH: TSH: 1.21 u[IU]/mL (ref 0.35–4.50)

## 2016-07-10 LAB — HEMOGLOBIN A1C: HEMOGLOBIN A1C: 5.4 % (ref 4.6–6.5)

## 2016-07-10 NOTE — Patient Instructions (Signed)
Complete el trabajo de laboratorio hoy antes de irse. Te llamar con los resultados de tu laboratorio una vez que los recibamos.  Contine comiendo una dieta balanceada con frutas, verduras y granos integrales.  Aumente el consumo de agua a 64 onzas diarias.  Fue Curatorun placer conocerte. Por favor llmeme o enveme un correo electrnico si tiene alguna pregunta. Nos pondremos en contacto con usted pronto.   Complete lab work today before you leave. I will call you with your lab results once we receive them.  Continue to eat a balanced diet with fruit, vegetables, whole grains.  Increase consumption of water to 64 ounces daily.  It was nice to meet you. Please call or e-mail me with any questions. We will be in touch with you soon.

## 2016-07-10 NOTE — Assessment & Plan Note (Signed)
Weight loss of 8 pounds in 1 year.  Doesn't seem to be diet or anxiety related. Will complete TSH, CMP, A1C today. Continue to closely monitor and also close follow up with GYN for abnormal pap.

## 2016-07-10 NOTE — Progress Notes (Signed)
Subjective:    Patient ID: Crystal Graves, female    DOB: [birth year], 31 y.o.   MRN: [id number]  HPI  Crystal Graves is a 31 year old female who presents today to establish care, complete physical, and discuss the problems mentioned below. Will obtain old records. She speaks Spanish mainly, little english. There is an interpreter with her today.  1) Unexplained Weight Loss: Weight loss of 8 pounds since January 2017. She's not trying to lose weight and is eating three meals daily with occasional snacks. She denies cold/hot intolerance, palpitations, family history of thyroid disease, family history of cancer, night sweats. She does have a history of abnormal pap smears and is due to see her gynecologist in several weeks.   Immunizations: -Tetanus: Completed in 2015 -Influenza: Completed Fall 2017.   Diet: She endorses a healthy. Breakfast: Coffee, bread, juice Lunch: Rice, chicken, vegetables Dinner: Meat, rice, beans Snacks: Crackers, fruit Desserts: None Beverages: Coffee, juice, soda, little water  Exercise: She does not currently exercise. Eye exam: Completed in 2017 Dental exam: Completes annually Pap Smear: She see's GYN. History of abnormal paps.    Review of Systems  Constitutional: Positive for fatigue and unexpected weight change.       Weight loss of 8 pounds since 06/2015.  HENT: Negative for rhinorrhea.   Respiratory: Negative for cough and shortness of breath.   Cardiovascular: Negative for chest pain.  Gastrointestinal: Negative for constipation and diarrhea.  Endocrine: Negative for cold intolerance.  Genitourinary: Negative for difficulty urinating and menstrual problem.  Musculoskeletal: Negative for arthralgias and myalgias.  Skin: Negative for rash.  Allergic/Immunologic: Negative for environmental allergies.  Neurological: Negative for dizziness, numbness and headaches.  Psychiatric/Behavioral:       Denies concerns for anxiety or depression         No past medical history on file.   Social History   Social History  . Marital status: Single    Spouse name: N/A  . Number of children: N/A  . Years of education: N/A   Occupational History  . Not on file.   Social History Main Topics  . Smoking status: Never Smoker  . Smokeless tobacco: Never Used  . Alcohol use 0.0 oz/week     Comment: Occasional  . Drug use: No  . Sexual activity: Yes    Birth control/ protection: Implant   Other Topics Concern  . Not on file   Social History Narrative   Single.   2 children.   Works as a Chartered certified accountant.   She enjoys dancing, going to the movies.     Past Surgical History:  Procedure Laterality Date  . COLPOSCOPY W/ BIOPSY / CURETTAGE  11/10/2014   Done for LGSIL pap smear  . GYNECOLOGIC CRYOSURGERY  12/10/2014   Done for CIN II (colposcopy pathology after LGSIL pap)    Family History  Problem Relation Age of Onset  . Hypertension Father     No Known Allergies  Current Outpatient Prescriptions on File Prior to Visit  Medication Sig Dispense Refill  . etonogestrel (NEXPLANON) 68 MG IMPL implant 1 each by Subdermal route once.     No current facility-administered medications on file prior to visit.     BP 108/68   Pulse 61   Temp 98.1 F (36.7 C) (Oral)   Ht 5\' 6"  (1.676 m)   Wt 112 lb 12.8 oz (51.2 kg)   SpO2 97%   BMI 18.21 kg/m    Objective:   Physical  Exam  Constitutional: She is oriented to person, place, and time. She appears well-nourished.  HENT:  Right Ear: Tympanic membrane and ear canal normal.  Left Ear: Tympanic membrane and ear canal normal.  Nose: Nose normal.  Mouth/Throat: Oropharynx is clear and moist.  Eyes: Conjunctivae and EOM are normal. Pupils are equal, round, and reactive to light.  Neck: Neck supple. No thyromegaly present.  Cardiovascular: Normal rate and regular rhythm.   No murmur heard. Pulmonary/Chest: Effort normal and breath sounds normal. She has no rales.   Abdominal: Soft. Bowel sounds are normal. There is no tenderness.  Musculoskeletal: Normal range of motion.  Lymphadenopathy:    She has no cervical adenopathy.  Neurological: She is alert and oriented to person, place, and time. She has normal reflexes. No cranial nerve deficit.  Skin: Skin is warm and dry. No rash noted.  Psychiatric: She has a normal mood and affect.          Assessment & Plan:

## 2016-07-10 NOTE — Assessment & Plan Note (Signed)
Interpreter present today. Will call interpreter line for lab results.

## 2016-07-10 NOTE — Assessment & Plan Note (Signed)
Following with GYN, due for follow up in several weeks.

## 2016-07-10 NOTE — Assessment & Plan Note (Signed)
Immunizations UTD. Pap UTD, following with GYN. Exam today unremarkable. Labs pending. Work up for unexplained weight loss pending, although no alarm signs. Follow up annually for physical.

## 2016-07-10 NOTE — Progress Notes (Signed)
Pre visit review using our clinic review tool, if applicable. No additional management support is needed unless otherwise documented below in the visit note. 

## 2016-07-18 ENCOUNTER — Encounter: Payer: Self-pay | Admitting: *Deleted

## 2016-07-19 ENCOUNTER — Encounter: Payer: Self-pay | Admitting: Obstetrics & Gynecology

## 2016-07-19 ENCOUNTER — Ambulatory Visit (INDEPENDENT_AMBULATORY_CARE_PROVIDER_SITE_OTHER): Payer: BLUE CROSS/BLUE SHIELD | Admitting: Obstetrics & Gynecology

## 2016-07-19 VITALS — BP 105/68 | HR 71 | Ht 66.0 in | Wt 114.0 lb

## 2016-07-19 DIAGNOSIS — R87610 Atypical squamous cells of undetermined significance on cytologic smear of cervix (ASC-US): Secondary | ICD-10-CM

## 2016-07-19 DIAGNOSIS — R87810 Cervical high risk human papillomavirus (HPV) DNA test positive: Secondary | ICD-10-CM

## 2016-07-19 DIAGNOSIS — Z3202 Encounter for pregnancy test, result negative: Secondary | ICD-10-CM | POA: Diagnosis not present

## 2016-07-19 DIAGNOSIS — Z01812 Encounter for preprocedural laboratory examination: Secondary | ICD-10-CM | POA: Diagnosis not present

## 2016-07-19 LAB — POCT URINE PREGNANCY: PREG TEST UR: NEGATIVE

## 2016-07-19 NOTE — Progress Notes (Signed)
    GYNECOLOGY CLINIC COLPOSCOPY PROCEDURE NOTE  31 y.o. [id number] here for colposcopy for ASCUS with POSITIVE high risk HPV pap smear on 06/27/2016.  Long history of cervical dysplasia in past; had cryotherapy in 2016 for CIN II and recent paps were normal but with persistent HPV.  Discussed role for HPV in cervical dysplasia, need for surveillance.  Patient given informed consent, signed copy in the chart, time out was performed.  Placed in lithotomy position. Cervix viewed with speculum and colposcope after application of acetic acid.   Colposcopy adequate? Yes Acetowhite lesion(s) noted at 11 o'clock; corresponding biopsies obtained.  ECC specimen obtained. All specimens were labeled and sent to pathology.   Patient was given post procedure instructions.  Will follow up pathology and manage accordingly.  Routine preventative health maintenance measures emphasized.    Jaynie CollinsUGONNA  Raylan Hanton, MD, FACOG Attending Obstetrician & Gynecologist, [facility], [facility]

## 2016-07-19 NOTE — Patient Instructions (Signed)
Colposcopía - Cuidados posteriores  (Colposcopy, Care After)  Siga estas instrucciones durante las próximas semanas. Estas indicaciones le proporcionan información general acerca de cómo deberá cuidarse después del procedimiento. El médico también podrá darle instrucciones más específicas. El tratamiento se ha planificado de acuerdo a las prácticas médicas actuales, pero a veces se producen problemas. Comuníquese con el médico si tiene algún problema o tiene dudas después del procedimiento.  QUÉ ESPERAR DESPUÉS DEL PROCEDIMIENTO   Después del procedimiento, es típico tener las siguientes sensaciones:  · Cólicos. Generalmente se calman en algunos minutos.  · Dolor. Puede durar hasta dos días.  · Aturdimiento. Si esto le ocurre, recuéstese durante algunos minutos.  Podrá tener un sangrado leve o una secreción oscura que debe detenerse en algunos días. Durante este tiempo deberá usar un apósito sanitario.  INSTRUCCIONES PARA EL CUIDADO EN EL HOGAR  · Evite las relaciones sexuales, las duchas vaginales y el uso de tampones durante 3 días, o según lo que le indique su médico.  · Tome sólo medicamentos de venta libre o recetados, según las indicaciones del médico. No tome aspirina, ya que puede causar hemorragias.  · Si utiliza píldoras anticonceptivas, continúe tomándolas.  · No todos los resultados estarán disponibles durante su visita. En este caso, tenga otra entrevista con su médico para conocerlos. No suponga que es normal si no tiene noticias de su médico o del establecimiento de salud. Es importante el seguimiento de todos los resultados de los estudios.  · Siga los consejos de su médico con respecto a los medicamentos, actividades, visitas y Papanicolau de control.  SOLICITE ATENCIÓN MÉDICA SI:  · Aparece una erupción cutánea.  · Tiene problemas con los medicamentos.  SOLICITE ATENCIÓN MÉDICA DE INMEDIATO SI:  · Tiene una hemorragia abundante o elimina coágulos.  · Tiene fiebre.  · Tiene flujo vaginal  anormal.  · Tiene cólicos que no se alivian luego de tomar analgésicos.  · Se siente mareada, tiene vahídos o se desmaya.  · Siente dolor en el estómago.  Esta información no tiene como fin reemplazar el consejo del médico. Asegúrese de hacerle al médico cualquier pregunta que tenga.  Document Released: 03/12/2013  Elsevier Interactive Patient Education © 2017 Elsevier Inc.

## 2016-07-27 ENCOUNTER — Telehealth: Payer: Self-pay | Admitting: *Deleted

## 2016-07-27 NOTE — Telephone Encounter (Signed)
Used interpreter line to phone pt about results, no answer, left VM to call the office.

## 2016-07-27 NOTE — Telephone Encounter (Signed)
-----   Message from Ugonna A Anyanwu, MD sent at 07/25/2016  4:27 PM EST ----- Patient has recurrent CIN II.  Needs LEEP procedure. Please schedule for patient.  Please call to inform patient of results and recommendations; Spanish speaking only.  

## 2016-08-01 ENCOUNTER — Telehealth: Payer: Self-pay | Admitting: *Deleted

## 2016-08-01 NOTE — Telephone Encounter (Signed)
Called pt via interpreter, no answer, left message to call the office.  Will send certified letter next week when interpreter is in office to type a spanish letter.

## 2016-08-01 NOTE — Telephone Encounter (Signed)
-----   Message from Ugonna A Anyanwu, MD sent at 07/25/2016  4:27 PM EST ----- Patient has recurrent CIN II.  Needs LEEP procedure. Please schedule for patient.  Please call to inform patient of results and recommendations; Spanish speaking only.  

## 2016-08-11 ENCOUNTER — Telehealth: Payer: Self-pay | Admitting: *Deleted

## 2016-08-11 NOTE — Telephone Encounter (Signed)
Informed pt via interpreter about results, scheduled LEEP for 09-05-16.

## 2016-08-11 NOTE — Telephone Encounter (Signed)
-----   Message from Tereso NewcomerUgonna A Anyanwu, MD sent at 07/25/2016  4:27 PM EST ----- Patient has recurrent CIN II.  Needs LEEP procedure. Please schedule for patient.  Please call to inform patient of results and recommendations; Spanish speaking only.

## 2016-09-05 ENCOUNTER — Encounter: Payer: Self-pay | Admitting: Obstetrics & Gynecology

## 2016-09-05 ENCOUNTER — Other Ambulatory Visit (HOSPITAL_COMMUNITY)
Admission: RE | Admit: 2016-09-05 | Discharge: 2016-09-05 | Disposition: A | Discharge status: Home / Self Care | Payer: BLUE CROSS/BLUE SHIELD | Source: Ambulatory Visit | Attending: Obstetrics & Gynecology | Admitting: Obstetrics & Gynecology

## 2016-09-05 ENCOUNTER — Ambulatory Visit (INDEPENDENT_AMBULATORY_CARE_PROVIDER_SITE_OTHER): Payer: BLUE CROSS/BLUE SHIELD | Admitting: Obstetrics & Gynecology

## 2016-09-05 VITALS — BP 90/59 | HR 56 | Wt 118.0 lb

## 2016-09-05 DIAGNOSIS — N871 Moderate cervical dysplasia: Secondary | ICD-10-CM

## 2016-09-05 DIAGNOSIS — N870 Mild cervical dysplasia: Secondary | ICD-10-CM | POA: Diagnosis not present

## 2016-09-05 NOTE — Progress Notes (Signed)
   GYNECOLOGY OFFICE PROCEDURE NOTE  Crystal Graves is a 31 y.o. [phone] here for LEEP for persistent CIN II. No GYN concerns. Pap smear and colposcopy reviewed.    CIN II initially noted on colposcopy done after LGSIL pap on 11/10/14 Cryotherapy done on 12/10/14 06/22/15 Normal cytology on pap smear with positive HRHPV, but negative HPV 16 and 18/45 -> repeat cotesting in one year recommended 06/27/16  ASCUS +HPV  Colposcopy showed persistent CIN II, LEEP recommeded  Patient is Spanish-speaking only, Spanish interpreter present for this encounter.  Risks, benefits, alternatives, and limitations of procedure explained to patient, including pain, bleeding, infection, failure to remove abnormal tissue and failure to cure dysplasia, need for repeat procedures, damage to pelvic organs, cervical incompetence.  Role of HPV,cervical dysplasia and need for close followup was empasized. Informed written consent was obtained. All questions were answered. Time out performed. Urine pregnancy test was negative.  Procedure: The patient was placed in lithotomy position and the bivalved coated speculum was placed in the patient's vagina. A grounding pad placed on the patient. Lugol's solution was applied to the cervix and areas of decreased uptake were noted around the transformation zone.   Local anesthesia was administered via an intracervical block using 10cc of 2% Lidocaine with epinephrine. The suction was turned on and the Medium 1X Fisher Cone Biopsy Excisor on 50 Watts of cutting current was used to excise the area of decreased uptake and excise the entire transformation zone. ECC was also done.  Excellent hemostasis was achieved using roller ball coagulation set at 50 Watts coagulation current. Monsel's solution was then applied and the speculum was removed from the vagina. Specimens were sent to pathology.  The patient tolerated the procedure well. Post-operative instructions given to patient,  including instruction to seek medical attention for persistent bright red bleeding, fever, abdominal/pelvic pain, dysuria, nausea or vomiting. She was also told about the possibility of having copious yellow to black tinged discharge for weeks. She was counseled to avoid anything in the vagina (sex/douching/tampons) for 3 weeks. She has a 4 week post-operative check to assess wound healing, review results and discuss further management.     [name], MD, FACOG Attending Obstetrician & Gynecologist, [facility] for [employer], [facility]

## 2016-09-05 NOTE — Patient Instructions (Signed)
Conizacin del cuello del tero, cuidados posteriores (Conization of the Cervix, Care After) Siga estas instrucciones durante las prximas semanas. Estas indicaciones le proporcionan informacin acerca de cmo deber cuidarse despus del procedimiento. El mdico tambin podr darle instrucciones ms especficas. El tratamiento se ha planificado de acuerdo a las prcticas mdicas actuales, pero a veces se producen problemas. Comunquese con el mdico si tiene algn problema o tiene dudas despus del procedimiento. QU ESPERAR DESPUS DEL PROCEDIMIENTO Despus del procedimiento, es tpico tener las siguientes sensaciones:  Podr sentir clicos (similar a los Tree surgeon) durante aproximadamente 1 semana.  Puede tener sangrado o hemorragia vaginal leves o moderados durante 1 o 2semanas. El sangrado no debe ser abundante (por ejemplo, no debe empapar un apsito en menos de 1 hora).  Podr tener una secrecin vaginal oscura, similar a la borra del caf. Es la pasta que le han aplicado en el cuello del tero para controlar el sangrado. Esto es normal. La recuperacin puede demorar hasta 3 semanas. INSTRUCCIONES PARA EL CUIDADO EN EL HOGAR  Pdale a alguna persona que la lleve a su casa luego del procedimiento.  Solo tome los [employer] indic el mdico. No tome aspirina. Puede ocasionar hemorragias.  Durante la primera semana tome duchas. No tome baos, no practique natacin ni use el jacuzzi hasta que el mdico la autorice.  No se haga duchas vaginales, no utilice tampones ni tenga relaciones sexuales hasta que el mdico la autorice.  Evite las actividades extenuantes y la prctica de ejercicios durante al menos 7 a 14 das.  Podr volver a su dieta normal, excepto que el mdico le indique otra cosa.  Si est estreida, podr:  Tomar un laxante suave segn las indicaciones del mdico.  Agregar frutas y salvado a su dieta.  Debe ingerir gran cantidad de lquido para  mantener la orina de tono claro o color amarillo plido.  Cumpla con todas las visitas de control con su mdico. SOLICITE ATENCIN MDICA SI:  Le aparece una erupcin cutnea.  Se siente mareada o sufre un desmayo.  Siente nuseas.  Tiene una secrecin vaginal con mal olor. SOLICITE ATENCIN MDICA DE INMEDIATO SI:  Observa cogulos sanguneos o una hemorragia ms abundante que un perodo menstrual normal (por ejemplo, si empapa un apsito en menos de 1 hora) o si la hemorragia es de color rojo brillante.  Tiene fiebre de ms de 101F (38,3C) o sntomas persistentes durante ms de 2 o 3das.  Tiene fiebre de ms de 101F (38,3C) y los sntomas empeoran repentinamente.  Siente cada vez ms clicos.  Se desmaya.  Siente dolor al [employer].  La orina tiene [place].  Comienza a vomitar.  El dolor no se alivia con los [employer].  El dolor es intenso o [place]. ASEGRESE DE QUE:  Comprende estas instrucciones.  Controlar su afeccin.  Recibir ayuda de inmediato si no mejora o si empeora. Esta informacin no tiene Theme park manager el consejo del mdico. Asegrese de hacerle al mdico cualquier pregunta que tenga. Document Released: 01/22/2013 Document Revised: 05/27/2013 Document Reviewed: 11/15/2012 Elsevier Interactive Patient Education  2017 [employer].

## 2016-09-11 ENCOUNTER — Telehealth: Payer: Self-pay | Admitting: *Deleted

## 2016-09-11 NOTE — Telephone Encounter (Signed)
-----   Message from Tereso Newcomer, MD sent at 09/07/2016  2:10 PM EDT ----- LEEP showed CIN I.  Will repeat cotesting in one year. Please call to inform patient of results and recommendations. Spanish speaking.

## 2016-09-11 NOTE — Telephone Encounter (Signed)
Called pt via interpreter, LM that results from procedure were good and to follow-up in one year for repeat pap smear.

## 2016-09-19 ENCOUNTER — Encounter: Payer: Self-pay | Admitting: Obstetrics and Gynecology

## 2016-09-19 ENCOUNTER — Ambulatory Visit (INDEPENDENT_AMBULATORY_CARE_PROVIDER_SITE_OTHER): Payer: BLUE CROSS/BLUE SHIELD | Admitting: Obstetrics and Gynecology

## 2016-09-19 VITALS — BP 105/67 | HR 63 | Ht 66.0 in | Wt 115.0 lb

## 2016-09-19 DIAGNOSIS — Z30011 Encounter for initial prescription of contraceptive pills: Secondary | ICD-10-CM | POA: Diagnosis not present

## 2016-09-19 DIAGNOSIS — Z3046 Encounter for surveillance of implantable subdermal contraceptive: Secondary | ICD-10-CM | POA: Diagnosis not present

## 2016-09-19 MED ORDER — NORGESTIMATE-ETH ESTRADIOL 0.25-35 MG-MCG PO TABS: 1.0000 | ORAL_TABLET | Freq: Every day | ORAL | 11 refills | Status: DC

## 2016-09-19 NOTE — Progress Notes (Signed)
31 yo G2P2 here for Nexplanon removal as it is approaching its expiration date. She plans on using OCP for contraception  Removal Patient given informed consent for removal of her Nexplanon, time out was performed.  Signed copy in the chart.  Appropriate time out taken. Implanon site identified.  Area prepped in usual sterile fashon. One cc of 1% lidocaine was used to anesthetize the area at the distal end of the implant. A small stab incision was made right beside the implant on the distal portion.  The Nexplanon rod was grasped using hemostats and removed without difficulty.  There was less than 3 cc blood loss. There were no complications.  A small amount of antibiotic ointment and steri-strips were applied over the small incision.  A pressure bandage was applied to reduce any bruising.  The patient tolerated the procedure well and was given post procedure instructions.  Follow up for cervical cancer screening in 1 year

## 2016-10-03 ENCOUNTER — Ambulatory Visit (INDEPENDENT_AMBULATORY_CARE_PROVIDER_SITE_OTHER): Payer: BLUE CROSS/BLUE SHIELD | Admitting: Obstetrics & Gynecology

## 2016-10-03 ENCOUNTER — Encounter: Payer: Self-pay | Admitting: Obstetrics & Gynecology

## 2016-10-03 VITALS — BP 99/63 | HR 61 | Wt 116.0 lb

## 2016-10-03 DIAGNOSIS — N898 Other specified noninflammatory disorders of vagina: Secondary | ICD-10-CM

## 2016-10-03 DIAGNOSIS — N871 Moderate cervical dysplasia: Secondary | ICD-10-CM | POA: Diagnosis not present

## 2016-10-03 DIAGNOSIS — Z9889 Other specified postprocedural states: Secondary | ICD-10-CM | POA: Diagnosis not present

## 2016-10-03 DIAGNOSIS — Z113 Encounter for screening for infections with a predominantly sexual mode of transmission: Secondary | ICD-10-CM | POA: Diagnosis not present

## 2016-10-03 NOTE — Progress Notes (Signed)
Subjective:     Crystal Graves is a 31 y.o. [id number] female who presents to the clinic 4 weeks status post LEEP for CIN II. Due to language barrier, a Spanish interpreter was present during the history-taking and subsequent discussion (and for part of the physical exam) with this patient. Reports some yellow discharge. The patient is not having any pain.  Having intercourse  The following portions of the patient's history were reviewed and updated as appropriate: allergies, current medications, past family history, past medical history, past social history, past surgical history and problem list.  Review of Systems Pertinent items noted in HPI and remainder of comprehensive ROS otherwise negative.    Objective:    BP 99/63 (BP Location: Left Arm, Patient Position: Sitting, Cuff Size: Normal)   Pulse 61   Wt 116 lb (52.6 kg)   BMI 18.72 kg/m  General:  alert and no distress  Abdomen: soft, bowel sounds active, non-tender  Pelvic:   healing well, yellow discharge seen, sample obtained for testing.    Surgical Pathology: CIN I, negative ECC  Assessment:    Doing well postoperatively. Operative findings again reviewed. Pathology report discussed.    Plan:   1. Continue any current medications. 2. Will follow up discharge analysis and manage accordingly. 3. Activity restrictions: none 4. Anticipated return to work: not applicable. 5. Follow up: 1 year for repeat pap and HPV test.    [name], MD, FACOG Attending Obstetrician & Gynecologist, Faculty Practice Center for [facility], [facility]

## 2016-10-03 NOTE — Patient Instructions (Signed)
Regresar en un ano para otra Papanicalou

## 2016-10-05 LAB — CERVICOVAGINAL ANCILLARY ONLY
Bacterial vaginitis: POSITIVE — AB
CHLAMYDIA, DNA PROBE: NEGATIVE
Candida vaginitis: NEGATIVE
NEISSERIA GONORRHEA: NEGATIVE
Trichomonas: NEGATIVE

## 2016-10-09 ENCOUNTER — Telehealth: Payer: Self-pay | Admitting: *Deleted

## 2016-10-09 ENCOUNTER — Encounter: Payer: Self-pay | Admitting: *Deleted

## 2016-10-09 MED ORDER — METRONIDAZOLE 500 MG PO TABS: 500.0000 mg | ORAL_TABLET | Freq: Two times a day (BID) | ORAL | 0 refills | Status: DC

## 2016-10-09 NOTE — Addendum Note (Signed)
Addended by: Jaynie CollinsANYANWU, Treniece Holsclaw A on: 10/09/2016 11:18 AM   Modules accepted: Orders

## 2016-10-09 NOTE — Telephone Encounter (Signed)
Called pt, no answer, called the pt via interpreter.  Left message to call the office.

## 2016-10-09 NOTE — Telephone Encounter (Signed)
-----   Message from Tereso NewcomerUgonna A Anyanwu, MD sent at 10/09/2016 11:18 AM EDT ----- Vaginal discharge test is abnormal and showed bacterial vaginitis.  Metronidazole was prescribed. Please inform patient of results and advise to pick up prescription.

## 2016-10-26 ENCOUNTER — Telehealth: Payer: Self-pay | Admitting: *Deleted

## 2016-10-26 DIAGNOSIS — B373 Candidiasis of vulva and vagina: Secondary | ICD-10-CM

## 2016-10-26 DIAGNOSIS — B3731 Acute candidiasis of vulva and vagina: Secondary | ICD-10-CM

## 2016-10-26 MED ORDER — FLUCONAZOLE 150 MG PO TABS: 150.0000 mg | ORAL_TABLET | Freq: Once | ORAL | 0 refills | Status: AC

## 2016-10-26 NOTE — Telephone Encounter (Signed)
-----   Message from Lindell SparHeather L Bacon, [place] sent at 10/26/2016  8:36 AM EDT ----- Regarding: yeast infection Contact: [phone] Please [facility] [place]

## 2016-10-26 NOTE — Telephone Encounter (Signed)
Pt called the office c/o a white discharge with vaginal itching persistent with a yeast infection.  Diflucan sent to the pharmacy and instructed pt on medication use.

## 2017-05-03 ENCOUNTER — Encounter: Payer: Self-pay | Admitting: Radiology

## 2017-06-29 ENCOUNTER — Encounter: Payer: Self-pay | Admitting: Obstetrics & Gynecology

## 2017-06-29 ENCOUNTER — Ambulatory Visit (INDEPENDENT_AMBULATORY_CARE_PROVIDER_SITE_OTHER): Payer: Managed Care, Other (non HMO) | Admitting: Obstetrics & Gynecology

## 2017-06-29 VITALS — BP 105/71 | HR 70 | Ht 66.0 in | Wt 119.0 lb

## 2017-06-29 DIAGNOSIS — Z01411 Encounter for gynecological examination (general) (routine) with abnormal findings: Secondary | ICD-10-CM | POA: Diagnosis not present

## 2017-06-29 DIAGNOSIS — N870 Mild cervical dysplasia: Secondary | ICD-10-CM

## 2017-06-29 DIAGNOSIS — Z3041 Encounter for surveillance of contraceptive pills: Secondary | ICD-10-CM | POA: Diagnosis not present

## 2017-06-29 DIAGNOSIS — Z124 Encounter for screening for malignant neoplasm of cervix: Secondary | ICD-10-CM | POA: Diagnosis not present

## 2017-06-29 DIAGNOSIS — Z113 Encounter for screening for infections with a predominantly sexual mode of transmission: Secondary | ICD-10-CM

## 2017-06-29 DIAGNOSIS — A749 Chlamydial infection, unspecified: Secondary | ICD-10-CM

## 2017-06-29 DIAGNOSIS — Z1151 Encounter for screening for human papillomavirus (HPV): Secondary | ICD-10-CM

## 2017-06-29 DIAGNOSIS — Z01419 Encounter for gynecological examination (general) (routine) without abnormal findings: Secondary | ICD-10-CM

## 2017-06-29 HISTORY — DX: Chlamydial infection, unspecified: A74.9

## 2017-06-29 NOTE — Patient Instructions (Signed)
Preventive Care 18-39 Years, Female Preventive care refers to lifestyle choices and visits with your health care provider that can promote health and wellness. What does preventive care include?  A yearly physical exam. This is also called an annual well check.  Dental exams once or twice a year.  Routine eye exams. Ask your health care provider how often you should have your eyes checked.  Personal lifestyle choices, including: ? Daily care of your teeth and gums. ? Regular physical activity. ? Eating a healthy diet. ? Avoiding tobacco and drug use. ? Limiting alcohol use. ? Practicing safe sex. ? Taking vitamin and mineral supplements as recommended by your health care provider. What happens during an annual well check? The services and screenings done by your health care provider during your annual well check will depend on your age, overall health, lifestyle risk factors, and family history of disease. Counseling Your health care provider may ask you questions about your:  Alcohol use.  Tobacco use.  Drug use.  Emotional well-being.  Home and relationship well-being.  Sexual activity.  Eating habits.  Work and work Statistician.  Method of birth control.  Menstrual cycle.  Pregnancy history.  Screening You may have the following tests or measurements:  Height, weight, and BMI.  Diabetes screening. This is done by checking your blood sugar (glucose) after you have not eaten for a while (fasting).  Blood pressure.  Lipid and cholesterol levels. These may be checked every 5 years starting at age 66.  Skin check.  Hepatitis C blood test.  Hepatitis B blood test.  Sexually transmitted disease (STD) testing.  BRCA-related cancer screening. This may be done if you have a family history of breast, ovarian, tubal, or peritoneal cancers.  Pelvic exam and Pap test. This may be done every 3 years starting at age 40. Starting at age 59, this may be done every 5  years if you have a Pap test in combination with an HPV test.  Discuss your test results, treatment options, and if necessary, the need for more tests with your health care provider. Vaccines Your health care provider may recommend certain vaccines, such as:  Influenza vaccine. This is recommended every year.  Tetanus, diphtheria, and acellular pertussis (Tdap, Td) vaccine. You may need a Td booster every 10 years.  Varicella vaccine. You may need this if you have not been vaccinated.  HPV vaccine. If you are 69 or younger, you may need three doses over 6 months.  Measles, mumps, and rubella (MMR) vaccine. You may need at least one dose of MMR. You may also need a second dose.  Pneumococcal 13-valent conjugate (PCV13) vaccine. You may need this if you have certain conditions and were not previously vaccinated.  Pneumococcal polysaccharide (PPSV23) vaccine. You may need one or two doses if you smoke cigarettes or if you have certain conditions.  Meningococcal vaccine. One dose is recommended if you are age 27-21 years and a first-year college student living in a residence hall, or if you have one of several medical conditions. You may also need additional booster doses.  Hepatitis A vaccine. You may need this if you have certain conditions or if you travel or work in places where you may be exposed to hepatitis A.  Hepatitis B vaccine. You may need this if you have certain conditions or if you travel or work in places where you may be exposed to hepatitis B.  Haemophilus influenzae type b (Hib) vaccine. You may need this if  you have certain risk factors.  Talk to your health care provider about which screenings and vaccines you need and how often you need them. This information is not intended to replace advice given to you by your health care provider. Make sure you discuss any questions you have with your health care provider. Document Released: 07/18/2001 Document Revised: 02/09/2016  Document Reviewed: 03/23/2015 Elsevier Interactive Patient Education  [employer].

## 2017-06-29 NOTE — Progress Notes (Signed)
GYNECOLOGY ANNUAL PREVENTATIVE CARE ENCOUNTER NOTE  Subjective:   Crystal Graves is a 32 y.o. [phone] female here for a routine annual gynecologic exam. Patient is Spanish-speaking only, Spanish interpreter present for this encounter. Current complaints: none.   Denies abnormal vaginal bleeding, discharge, pelvic pain, problems with intercourse or other gynecologic concerns.  Desires annual STI screen, also desires Nexplanon for long term contraception, currently on OCPs.   Gynecologic History Patient's last menstrual period was 06/18/2017. Contraception: OCP (estrogen/progesterone) CIN II noted on colposcopy done after LGSIL pap on 11/10/14 Cryotherapy done on 12/10/14 06/22/15 Normal cytology on pap smear with positive HRHPV, but negative HPV 16 and 18/45 -> repeat cotesting in one year 06/27/16  ASCUS +HPV  Colposcopy showed CIN II, LEEP recommeded LEEP on 09/05/2016 showed CIN I.   Obstetric History OB History  Gravida Para Term Preterm AB Living  2 2 2  0 0 2  SAB TAB Ectopic Multiple Live Births  0 0 0 0 2    # Outcome Date GA Lbr Len/2nd Weight Sex Delivery Anes PTL Lv  2 Term      Vag-Spont   LIV  1 Term      Vag-Spont   LIV      Past Medical History:  Diagnosis Date  . ASCUS with positive high risk HPV cervical     Past Surgical History:  Procedure Laterality Date  . COLPOSCOPY W/ BIOPSY / CURETTAGE  11/10/2014   Done for LGSIL pap smear  . GYNECOLOGIC CRYOSURGERY  12/10/2014   Done for CIN II (colposcopy pathology after LGSIL pap)    Current Outpatient Medications on File Prior to Visit  Medication Sig Dispense Refill  . norgestimate-ethinyl estradiol (ORTHO-CYCLEN,SPRINTEC,PREVIFEM) 0.25-35 MG-MCG tablet Take 1 tablet by mouth daily. 1 Package 11   No current facility-administered medications on file prior to visit.     No Known Allergies  Social History   Socioeconomic History  . Marital status: Single    Spouse name: Not on file  . Number of  children: Not on file  . Years of education: Not on file  . Highest education level: Not on file  Social Needs  . Financial resource strain: Not on file  . Food insecurity - worry: Not on file  . Food insecurity - inability: Not on file  . Transportation needs - medical: Not on file  . Transportation needs - non-medical: Not on file  Occupational History  . Not on file  Tobacco Use  . Smoking status: Never Smoker  . Smokeless tobacco: Never Used  Substance and Sexual Activity  . Alcohol use: Yes    Alcohol/week: 0.0 oz    Comment: Occasional  . Drug use: No  . Sexual activity: Yes    Birth control/protection: Pill  Other Topics Concern  . Not on file  Social History Narrative   Single.   2 children.   Works as a Chartered certified accountant.   She enjoys dancing, going to the movies.     Family History  Problem Relation Age of Onset  . Hypertension Father     The following portions of the patient's history were reviewed and updated as appropriate: allergies, current medications, past family history, past medical history, past social history, past surgical history and problem list.  Review of Systems Pertinent items noted in HPI and remainder of comprehensive ROS otherwise negative.   Objective:  BP 105/71   Pulse 70   Ht 5\' 6"  (1.676 m)   Wt 119  lb (54 kg)   LMP 06/18/2017   BMI 19.21 kg/m  CONSTITUTIONAL: Well-developed, well-nourished female in no acute distress.  HENT:  Normocephalic, atraumatic, External right and left ear normal. Oropharynx is clear and moist EYES: Conjunctivae and EOM are normal. Pupils are equal, round, and reactive to light. No scleral icterus.  NECK: Normal range of motion, supple, no masses.  Normal thyroid.  SKIN: Skin is warm and dry. No rash noted. Not diaphoretic. No erythema. No pallor. NEUROLOGIC: Alert and oriented to person, place, and time. Normal reflexes, muscle tone coordination. No cranial nerve deficit noted. PSYCHIATRIC: Normal mood and  affect. Normal behavior. Normal judgment and thought content. CARDIOVASCULAR: Normal heart rate noted, regular rhythm RESPIRATORY: Clear to auscultation bilaterally. Effort and breath sounds normal, no problems with respiration noted. BREASTS: Symmetric in size. No masses, skin changes, nipple drainage, or lymphadenopathy. ABDOMEN: Soft, normal bowel sounds, no distention noted.  No tenderness, rebound or guarding.  PELVIC: Normal appearing external genitalia; normal appearing vaginal mucosa and cervix.  No abnormal discharge noted.  Pap smear obtained.  Normal uterine size, no other palpable masses, no uterine or adnexal tenderness. MUSCULOSKELETAL: Normal range of motion. No tenderness.  No cyanosis, clubbing, or edema.  2+ distal pulses.   Assessment and Plan:  1. Oral contraceptive pill surveillance Satisfied with pills but wants long term option. Will return for Nexplanon placement soon.  2. CIN I (cervical intraepithelial neoplasia I) 3. Well woman exam with routine gynecological exam - Cytology - PAP - Hepatitis C antibody - Hepatitis B surface antigen - HIV antibody - RPR Will follow up results of pap smear with ancillary testing and manage accordingly. Routine preventative health maintenance measures emphasized. Please refer to After Visit Summary for other counseling recommendations.    Jaynie CollinsUGONNA  ANYANWU, MD, FACOG Obstetrician & Gynecologist, [facility], [facility]

## 2017-06-29 NOTE — Progress Notes (Signed)
Desires STI screening. She wants to try Nexplanon again and stop OCP.

## 2017-06-30 LAB — RPR: RPR Ser Ql: NONREACTIVE

## 2017-06-30 LAB — HEPATITIS B SURFACE ANTIGEN: HEP B S AG: NEGATIVE

## 2017-06-30 LAB — HEPATITIS C ANTIBODY

## 2017-06-30 LAB — HIV ANTIBODY (ROUTINE TESTING W REFLEX): HIV SCREEN 4TH GENERATION: NONREACTIVE

## 2017-07-10 ENCOUNTER — Ambulatory Visit: Payer: Managed Care, Other (non HMO) | Admitting: Obstetrics and Gynecology

## 2017-07-10 LAB — CYTOLOGY - PAP
CHLAMYDIA, DNA PROBE: POSITIVE — AB
Diagnosis: NEGATIVE
HPV: NOT DETECTED
NEISSERIA GONORRHEA: NEGATIVE
Trichomonas: NEGATIVE

## 2017-07-11 ENCOUNTER — Encounter: Payer: Self-pay | Admitting: Obstetrics & Gynecology

## 2017-07-11 ENCOUNTER — Other Ambulatory Visit: Payer: Self-pay | Admitting: Obstetrics & Gynecology

## 2017-07-11 DIAGNOSIS — A749 Chlamydial infection, unspecified: Secondary | ICD-10-CM

## 2017-07-11 MED ORDER — AZITHROMYCIN 500 MG PO TABS: 1000.0000 mg | ORAL_TABLET | Freq: Once | ORAL | 1 refills | Status: AC

## 2017-07-11 NOTE — Progress Notes (Signed)
Patient has a positive Chlamydia test.  Recommend testing for other STIs, also needs to let partner(s) know so the partner(s) can get testing and treatment. Patient and sex partner(s) should abstain from unprotected sexual activity for seven days after everyone receives appropriate treatment.  Azithromycin was prescribed for patient.  Patient will need to return in about 4 weeks after treatment for repeat test of cure.  Please call to inform patient of results and recommendations, and advise to pick up prescription. She is Spanish speaking only.   Jaynie CollinsUGONNA  Libbie Bartley, MD, FACOG Obstetrician & Gynecologist, [facility], [facility]

## 2017-07-24 ENCOUNTER — Ambulatory Visit (INDEPENDENT_AMBULATORY_CARE_PROVIDER_SITE_OTHER): Payer: Managed Care, Other (non HMO) | Admitting: Obstetrics and Gynecology

## 2017-07-24 ENCOUNTER — Encounter: Payer: Self-pay | Admitting: Obstetrics and Gynecology

## 2017-07-24 VITALS — BP 125/78 | HR 68 | Wt 122.0 lb

## 2017-07-24 DIAGNOSIS — Z3202 Encounter for pregnancy test, result negative: Secondary | ICD-10-CM

## 2017-07-24 DIAGNOSIS — Z3046 Encounter for surveillance of implantable subdermal contraceptive: Secondary | ICD-10-CM

## 2017-07-24 DIAGNOSIS — Z30017 Encounter for initial prescription of implantable subdermal contraceptive: Secondary | ICD-10-CM

## 2017-07-24 LAB — POCT URINE PREGNANCY: Preg Test, Ur: NEGATIVE

## 2017-07-24 MED ORDER — ETONOGESTREL 68 MG ~~LOC~~ IMPL
68.0000 mg | DRUG_IMPLANT | Freq: Once | SUBCUTANEOUS | Status: AC
  Administered 2017-07-24: 68 mg via SUBCUTANEOUS

## 2017-07-24 NOTE — Procedures (Signed)
Nexplanon Insertion Procedure Note Prior to the procedure being performed, the patient (or guardian) was asked to state their full name, date of birth, type of procedure being performed and the exact location of the operative site. This information was then checked against the documentation in the patient's chart. Prior to the procedure being performed, a "time out" was performed by the physician that confirmed the correct patient, procedure and site. UPT negative and last intercourse at least two weeks ago.   After informed consent was obtained, the patient's non-dominant right arm was chosen for insertion. The old site was identified, and since it was in the proper location, it was chosen for the insertion site. The area was cleaned with alcohol then local anesthesia was infiltrated with 3 ml of 1% lidocaine along the planned insertion track. The area was prepped with betadine. Using sterile technique the Nexplanon device was inserted per manufacturer's guidelines in the subdermal connective tissue using the standard insertion technique without difficulty. Pressure was applied and the insertion site was hemostatic. The presence of the Nexplanon was confirmed immediately after insertion by palpation by both me and the patient and by checking the tip of needle for the absence of the insert.  A pressure dressing was applied.  The patient tolerated the procedure well.  She was told to overlap her pills by another week  Interpreter used.   Cornelia Copaharlie Deantre Bourdon, Jr MD Attending Center for [facility] Midwife(Faculty Practice)

## 2017-08-01 ENCOUNTER — Other Ambulatory Visit (INDEPENDENT_AMBULATORY_CARE_PROVIDER_SITE_OTHER): Payer: Managed Care, Other (non HMO) | Admitting: *Deleted

## 2017-08-01 DIAGNOSIS — N898 Other specified noninflammatory disorders of vagina: Secondary | ICD-10-CM

## 2017-08-01 NOTE — Progress Notes (Signed)
Chart reviewed for nurse visit. Agree with plan of care.   Marylene LandKooistra, Jamiya Nims Lorraine, CNM 08/01/2017 5:24 PM

## 2017-08-01 NOTE — Progress Notes (Signed)
SUBJECTIVE:  32 y.o. female complains of white vaginal discharge and odor for 4 day(s). Denies abnormal vaginal bleeding or significant pelvic pain or fever. No UTI symptoms. Denies history of known exposure to STD.  Patient's last menstrual period was 07/16/2017.  OBJECTIVE:  She appears well, afebrile. Urine dipstick: not done.  ASSESSMENT:  Vaginal Discharge  Vaginal Odor   PLAN:  GC, chlamydia, trichomonas, BVAG, CVAG probe sent to lab. Treatment: To be determined once lab results are received ROV prn if symptoms persist or worsen.

## 2017-08-06 LAB — CERVICOVAGINAL ANCILLARY ONLY
Bacterial vaginitis: POSITIVE — AB
Candida vaginitis: POSITIVE — AB
Chlamydia: NEGATIVE
Neisseria Gonorrhea: NEGATIVE
TRICH (WINDOWPATH): NEGATIVE

## 2017-08-07 ENCOUNTER — Other Ambulatory Visit: Payer: Self-pay | Admitting: Student

## 2017-08-07 MED ORDER — FLUCONAZOLE 150 MG PO TABS: 150.0000 mg | ORAL_TABLET | Freq: Once | ORAL | 0 refills | Status: AC

## 2017-08-07 MED ORDER — METRONIDAZOLE 500 MG PO TABS
500.0000 mg | ORAL_TABLET | Freq: Two times a day (BID) | ORAL | 0 refills | Status: DC
Start: 2017-08-07 — End: 2017-09-25

## 2017-08-10 ENCOUNTER — Other Ambulatory Visit (INDEPENDENT_AMBULATORY_CARE_PROVIDER_SITE_OTHER): Payer: Managed Care, Other (non HMO)

## 2017-08-10 VITALS — BP 108/72 | HR 72

## 2017-08-10 DIAGNOSIS — A749 Chlamydial infection, unspecified: Secondary | ICD-10-CM | POA: Diagnosis not present

## 2017-08-10 DIAGNOSIS — Z113 Encounter for screening for infections with a predominantly sexual mode of transmission: Secondary | ICD-10-CM

## 2017-08-10 DIAGNOSIS — N898 Other specified noninflammatory disorders of vagina: Secondary | ICD-10-CM

## 2017-08-10 NOTE — Progress Notes (Signed)
SUBJECTIVE:  32 y.o. female patient presented today for test of cure for chlamydia infection dx in February. Denies abnormal vaginal bleeding or significant pelvic pain or fever. No UTI symptoms. Denies history of known recent exposure to STD.  Patient's last menstrual period was 07/16/2017.  OBJECTIVE:  She appears well, afebrile. Urine dipstick: not check today  ASSESSMENT:  Vaginal Discharge none at this time Vaginal Odor   PLAN:  GC, chlamydia, trichomonas, BVAG, CVAG probe sent to lab. Treatment: To be determined once lab results are received ROV prn if symptoms persist or worsen.

## 2017-08-13 LAB — CERVICOVAGINAL ANCILLARY ONLY
BACTERIAL VAGINITIS: POSITIVE — AB
Candida vaginitis: NEGATIVE
Chlamydia: NEGATIVE
Neisseria Gonorrhea: NEGATIVE
Trichomonas: NEGATIVE

## 2017-09-25 ENCOUNTER — Other Ambulatory Visit (INDEPENDENT_AMBULATORY_CARE_PROVIDER_SITE_OTHER): Payer: Managed Care, Other (non HMO)

## 2017-09-25 DIAGNOSIS — N760 Acute vaginitis: Secondary | ICD-10-CM

## 2017-09-25 DIAGNOSIS — N898 Other specified noninflammatory disorders of vagina: Secondary | ICD-10-CM

## 2017-09-25 DIAGNOSIS — Z113 Encounter for screening for infections with a predominantly sexual mode of transmission: Secondary | ICD-10-CM

## 2017-09-25 DIAGNOSIS — B373 Candidiasis of vulva and vagina: Secondary | ICD-10-CM

## 2017-09-25 MED ORDER — FLUCONAZOLE 150 MG PO TABS: 150.0000 mg | ORAL_TABLET | ORAL | 3 refills | Status: DC

## 2017-09-25 NOTE — Progress Notes (Unsigned)
SUBJECTIVE:  32 y.o. female complains of white vaginal discharge and vaginal itching for 1 week(s). Denies abnormal vaginal bleeding or significant pelvic pain or fever. No UTI symptoms. Denies history of known exposure to STD.  No LMP recorded. (Menstrual status: Oral contraceptives).  OBJECTIVE:  She appears well, afebrile.  ASSESSMENT:  Vaginal Discharge  Vaginal Odor   PLAN:  GC, chlamydia, trichomonas, BVAG, CVAG probe sent to lab. Treatment: To be determined once lab results are received ROV prn if symptoms persist or worsen.

## 2017-09-26 LAB — CERVICOVAGINAL ANCILLARY ONLY
Bacterial vaginitis: NEGATIVE
Candida vaginitis: POSITIVE — AB
Chlamydia: NEGATIVE
Neisseria Gonorrhea: NEGATIVE
Trichomonas: NEGATIVE

## 2017-09-27 ENCOUNTER — Telehealth: Payer: Self-pay

## 2017-09-27 NOTE — Telephone Encounter (Signed)
Patient has yeast infection and has been notified to pick up rx

## 2017-09-27 NOTE — Telephone Encounter (Signed)
-----   Message from Reva Boresanya S Pratt, MD sent at 09/26/2017  4:09 PM EDT ----- Has yeast--send in Diflucan 150 po x 1

## 2018-05-28 ENCOUNTER — Encounter: Payer: Self-pay | Admitting: Family Medicine

## 2018-05-28 ENCOUNTER — Ambulatory Visit (INDEPENDENT_AMBULATORY_CARE_PROVIDER_SITE_OTHER): Payer: Managed Care, Other (non HMO) | Admitting: Family Medicine

## 2018-05-28 VITALS — BP 94/76 | HR 93 | Temp 98.5°F | Ht 66.0 in | Wt 118.8 lb

## 2018-05-28 DIAGNOSIS — J028 Acute pharyngitis due to other specified organisms: Secondary | ICD-10-CM | POA: Diagnosis not present

## 2018-05-28 DIAGNOSIS — B9789 Other viral agents as the cause of diseases classified elsewhere: Secondary | ICD-10-CM

## 2018-05-28 LAB — POCT RAPID STREP A (OFFICE): Rapid Strep A Screen: NEGATIVE

## 2018-05-28 NOTE — Patient Instructions (Addendum)
Based on your symptoms, it looks like you have a virus.   Antibiotics are not need for a viral infection but the following will help:   1. Drink plenty of fluids 2. Get lots of rest  Sinus Congestion 1) Neti Pot (Saline rinse) -- 2 times day -- if tolerated 2) Flonase (Store Brand ok) - once daily 3) Over the counter congestion medications   Sore Throat 1) Honey as above, cough drops (with menthol, dyclonine, benzocaine, or hexylresorcinol) 2) Ibuprofen or Aleve can be helpful 3) Salt water Gargles 4) Throat Sprays: with phenol or benzocaine  If you develop fevers (Temperature >100.4), chills, worsening symptoms or symptoms lasting longer than 10 days return to clinic.

## 2018-05-28 NOTE — Progress Notes (Signed)
Subjective:     Crystal Graves is a 32 y.o. female presenting for Sore Throat (x 1 week. Post nasal drainage, runy nose, head pressure last week, had some chills. No fever that she knows of.  Has taking Mucinex, Advil and Theraflu)     URI   This is a new problem. The current episode started 1 to 4 weeks ago. There has been no fever. Associated symptoms include coughing, headaches, rhinorrhea and a sore throat. Pertinent negatives include no abdominal pain, diarrhea, ear pain, nausea, neck pain, plugged ear sensation, swollen glands or vomiting. She has tried decongestant and NSAIDs for the symptoms. The treatment provided mild relief.   Thinks she was around people who were sick last week  Review of Systems  Constitutional: Positive for chills.  HENT: Positive for postnasal drip, rhinorrhea, sinus pressure and sore throat. Negative for ear pain.   Respiratory: Positive for cough.   Gastrointestinal: Negative for abdominal pain, diarrhea, nausea and vomiting.  Musculoskeletal: Negative for neck pain.  Neurological: Positive for headaches.     Social History   Tobacco Use  Smoking Status Never Smoker  Smokeless Tobacco Never Used        Objective:    BP Readings from Last 3 Encounters:  05/28/18 94/76  08/10/17 108/72  07/24/17 125/78   Wt Readings from Last 3 Encounters:  05/28/18 118 lb 12 oz (53.9 kg)  07/24/17 122 lb (55.3 kg)  06/29/17 119 lb (54 kg)    BP 94/76   Pulse 93   Temp 98.5 F (36.9 C)   Ht 5\' 6"  (1.676 m) Comment: per patient  Wt 118 lb 12 oz (53.9 kg)   SpO2 99%   BMI 19.17 kg/m    Physical Exam Constitutional:      General: She is not in acute distress.    Appearance: She is well-developed. She is not diaphoretic.  HENT:     Head: Normocephalic and atraumatic.     Right Ear: Tympanic membrane and ear canal normal.     Left Ear: Tympanic membrane and ear canal normal.     Nose: Mucosal edema and rhinorrhea present.   Right Sinus: No maxillary sinus tenderness or frontal sinus tenderness.     Left Sinus: No maxillary sinus tenderness or frontal sinus tenderness.     Mouth/Throat:     Pharynx: Uvula midline. Posterior oropharyngeal erythema present. No oropharyngeal exudate.     Tonsils: Swelling: 2+ on the right. 2+ on the left.  Eyes:     General: No scleral icterus.    Conjunctiva/sclera: Conjunctivae normal.  Neck:     Musculoskeletal: Neck supple.  Cardiovascular:     Rate and Rhythm: Normal rate and regular rhythm.     Heart sounds: Normal heart sounds. No murmur.  Pulmonary:     Effort: Pulmonary effort is normal. No respiratory distress.     Breath sounds: Normal breath sounds.  Lymphadenopathy:     Cervical: No cervical adenopathy.  Skin:    General: Skin is warm and dry.     Capillary Refill: Capillary refill takes less than 2 seconds.  Neurological:     Mental Status: She is alert.     Rapid Strep: negative       Assessment & Plan:   Problem List Items Addressed This Visit    None    Visit Diagnoses    Sore throat (viral)    -  Primary   Relevant Orders   Rapid Strep  A (Completed)     Symptomatic care  Return if symptoms worsen or fail to improve.  Lynnda ChildJessica R Tejon Gracie, MD

## 2018-05-30 ENCOUNTER — Other Ambulatory Visit: Payer: Self-pay | Admitting: Primary Care

## 2018-05-30 DIAGNOSIS — Z0000 Encounter for general adult medical examination without abnormal findings: Secondary | ICD-10-CM

## 2018-06-07 ENCOUNTER — Other Ambulatory Visit (INDEPENDENT_AMBULATORY_CARE_PROVIDER_SITE_OTHER): Payer: Managed Care, Other (non HMO)

## 2018-06-07 DIAGNOSIS — Z0000 Encounter for general adult medical examination without abnormal findings: Secondary | ICD-10-CM

## 2018-06-07 LAB — COMPREHENSIVE METABOLIC PANEL
ALT: 24 U/L (ref 0–35)
AST: 21 U/L (ref 0–37)
Albumin: 4.1 g/dL (ref 3.5–5.2)
Alkaline Phosphatase: 69 U/L (ref 39–117)
BILIRUBIN TOTAL: 0.9 mg/dL (ref 0.2–1.2)
BUN: 13 mg/dL (ref 6–23)
CO2: 28 meq/L (ref 19–32)
Calcium: 9.2 mg/dL (ref 8.4–10.5)
Chloride: 104 mEq/L (ref 96–112)
Creatinine, Ser: 0.63 mg/dL (ref 0.40–1.20)
GFR: 117.1 mL/min (ref >60.00)
Glucose, Bld: 92 mg/dL (ref 70–99)
POTASSIUM: 3.7 meq/L (ref 3.5–5.1)
Sodium: 140 mEq/L (ref 135–145)
Total Protein: 7.2 g/dL (ref 6.0–8.3)

## 2018-06-07 LAB — LIPID PANEL
CHOL/HDL RATIO: 3
Cholesterol: 174 mg/dL (ref 0–200)
HDL: 54.6 mg/dL (ref >39.00)
LDL Cholesterol: 105 mg/dL — ABNORMAL HIGH (ref 0–99)
NONHDL: 118.92
Triglycerides: 70 mg/dL (ref 0.0–149.0)
VLDL: 14 mg/dL (ref 0.0–40.0)

## 2018-06-14 ENCOUNTER — Ambulatory Visit (INDEPENDENT_AMBULATORY_CARE_PROVIDER_SITE_OTHER): Payer: Managed Care, Other (non HMO) | Admitting: Primary Care

## 2018-06-14 ENCOUNTER — Encounter: Payer: Self-pay | Admitting: Primary Care

## 2018-06-14 VITALS — BP 100/62 | HR 59 | Temp 98.1°F | Ht 66.0 in | Wt 120.8 lb

## 2018-06-14 DIAGNOSIS — Z0000 Encounter for general adult medical examination without abnormal findings: Secondary | ICD-10-CM

## 2018-06-14 DIAGNOSIS — R413 Other amnesia: Secondary | ICD-10-CM

## 2018-06-14 LAB — CBC
HEMATOCRIT: 39.5 % (ref 36.0–46.0)
Hemoglobin: 13.2 g/dL (ref 12.0–15.0)
MCHC: 33.5 g/dL (ref 30.0–36.0)
MCV: 91.1 fl (ref 78.0–100.0)
Platelets: 203 10*3/uL (ref 150.0–400.0)
RBC: 4.33 Mil/uL (ref 3.87–5.11)
RDW: 12.9 % (ref 11.5–15.5)
WBC: 4.2 10*3/uL (ref 4.0–10.5)

## 2018-06-14 LAB — VITAMIN B12: Vitamin B-12: 516 pg/mL (ref 211–911)

## 2018-06-14 LAB — TSH: TSH: 1.95 u[IU]/mL (ref 0.35–4.50)

## 2018-06-14 NOTE — Assessment & Plan Note (Signed)
Immunizations UTD. Pap smear UTD, following with GYN. Recommended regular exercise, healthy diet. Discussed reduction of alcohol on the weekends. Exam unremarkable. Labs reviewed. Additional labs pending. Follow up in 1 year for CPE

## 2018-06-14 NOTE — Assessment & Plan Note (Signed)
Present for the last year, mostly when drinking alcohol. Exam today unremarkable. Check labs today including CBC, TSH, B12. Discussed to limit alcohol, work on [employer], stress reduction.

## 2018-06-14 NOTE — Progress Notes (Signed)
Subjective:    Patient ID: Crystal Graves, female    DOB: 02/16/1986, 33 y.o.   MRN: [id number]  HPI  [name] is a 33 year old female who presents today for complete physical.  Immunizations: -Tetanus: Completed in 2015 -Influenza: Completed this season   Diet: She endorses a healthy diet Breakfast: Bread Lunch: Rice, beans, meat Dinner: Tacos, otherwise not much Snacks: Fruit, granola bar Desserts: None Beverages: Coffee, water, little soda  Exercise: She does not exercising, plans on exercising at the gym Eye exam: Completed in 2019 Dental exam: Completes semi-annually Pap Smear: Completed in 2019   Review of Systems  Constitutional: Negative for unexpected weight change.  HENT: Negative for rhinorrhea.   Respiratory: Negative for cough and shortness of breath.   Cardiovascular: Negative for chest pain.  Gastrointestinal: Negative for constipation and diarrhea.  Genitourinary: Negative for difficulty urinating.  Musculoskeletal: Negative for arthralgias and myalgias.  Skin: Negative for rash.  Allergic/Immunologic: Negative for environmental allergies.  Neurological: Negative for dizziness, numbness and headaches.       She has noticed some memory problems, having trouble remembering doing things and where she puts things at time. This mostly occurs after drinking alcohol.   Psychiatric/Behavioral: The patient is not nervous/anxious.        Past Medical History:  Diagnosis Date  . ASCUS with positive high risk HPV cervical   . Chlamydia infection 06/29/2017     Social History   Socioeconomic History  . Marital status: Single    Spouse name: Not on file  . Number of children: Not on file  . Years of education: Not on file  . Highest education level: Not on file  Occupational History  . Not on file  Social Needs  . Financial resource strain: Not on file  . Food insecurity:    Worry: Not on file    Inability: Not on file  . Transportation  needs:    Medical: Not on file    Non-medical: Not on file  Tobacco Use  . Smoking status: Never Smoker  . Smokeless tobacco: Never Used  Substance and Sexual Activity  . Alcohol use: Yes    Alcohol/week: 0.0 standard drinks    Comment: Occasional  . Drug use: No  . Sexual activity: Yes    Birth control/protection: Pill  Lifestyle  . Physical activity:    Days per week: Not on file    Minutes per session: Not on file  . Stress: Not on file  Relationships  . Social connections:    Talks on phone: Not on file    Gets together: Not on file    Attends religious service: Not on file    Active member of club or organization: Not on file    Attends meetings of clubs or organizations: Not on file    Relationship status: Not on file  . Intimate partner violence:    Fear of current or ex partner: Not on file    Emotionally abused: Not on file    Physically abused: Not on file    Forced sexual activity: Not on file  Other Topics Concern  . Not on file  Social History Narrative   Single.   2 children.   Works as a Chartered certified accountant.   She enjoys dancing, going to the movies.     Past Surgical History:  Procedure Laterality Date  . COLPOSCOPY W/ BIOPSY / CURETTAGE  11/10/2014   Done for LGSIL pap smear  .  GYNECOLOGIC CRYOSURGERY  12/10/2014   Done for CIN II (colposcopy pathology after LGSIL pap)    Family History  Problem Relation Age of Onset  . Hypertension Father     No Known Allergies  No current outpatient medications on file prior to visit.   No current facility-administered medications on file prior to visit.     BP 100/62   Pulse (!) 59   Temp 98.1 F (36.7 C) (Oral)   Ht 5\' 6"  (1.676 m)   Wt 120 lb 12 oz (54.8 kg)   SpO2 99%   BMI 19.49 kg/m    Objective:   Physical Exam  Constitutional: She is oriented to person, place, and time. She appears well-nourished.  HENT:  Mouth/Throat: No oropharyngeal exudate.  Eyes: Pupils are equal, round, and reactive to  light. EOM are normal.  Neck: Neck supple. No thyromegaly present.  Cardiovascular: Normal rate and regular rhythm.  Respiratory: Effort normal and breath sounds normal.  GI: Soft. Bowel sounds are normal. There is no abdominal tenderness.  Musculoskeletal: Normal range of motion.  Neurological: She is alert and oriented to person, place, and time.  Skin: Skin is warm and dry.  Psychiatric: She has a normal mood and affect.           Assessment & Plan:

## 2018-06-14 NOTE — Patient Instructions (Signed)
Stop by the lab prior to leaving today. I will notify you of your results once received.   Start exercising. You should be getting 150 minutes of moderate intensity exercise weekly.  It's important to improve your diet by reducing consumption of fast food, fried food, processed snack foods, sugary drinks. Increase consumption of fresh vegetables and fruits, whole grains, water.  Ensure you are drinking 64 ounces of water daily.  We will see you in one year for your annual exam or sooner if needed.  It was a pleasure to see you today!   Preventive Care 18-39 Years, Female Preventive care refers to lifestyle choices and visits with your health care provider that can promote health and wellness. What does preventive care include?   A yearly physical exam. This is also called an annual well check.  Dental exams once or twice a year.  Routine eye exams. Ask your health care provider how often you should have your eyes checked.  Personal lifestyle choices, including: ? Daily care of your teeth and gums. ? Regular physical activity. ? Eating a healthy diet. ? Avoiding tobacco and drug use. ? Limiting alcohol use. ? Practicing safe sex. ? Taking vitamin and mineral supplements as recommended by your health care provider. What happens during an annual well check? The services and screenings done by your health care provider during your annual well check will depend on your age, overall health, lifestyle risk factors, and family history of disease. Counseling Your health care provider may ask you questions about your:  Alcohol use.  Tobacco use.  Drug use.  Emotional well-being.  Home and relationship well-being.  Sexual activity.  Eating habits.  Work and work Statistician.  Method of birth control.  Menstrual cycle.  Pregnancy history. Screening You may have the following tests or measurements:  Height, weight, and BMI.  Diabetes screening. This is done by  checking your blood sugar (glucose) after you have not eaten for a while (fasting).  Blood pressure.  Lipid and cholesterol levels. These may be checked every 5 years starting at age 86.  Skin check.  Hepatitis C blood test.  Hepatitis B blood test.  Sexually transmitted disease (STD) testing.  BRCA-related cancer screening. This may be done if you have a family history of breast, ovarian, tubal, or peritoneal cancers.  Pelvic exam and Pap test. This may be done every 3 years starting at age 39. Starting at age 67, this may be done every 5 years if you have a Pap test in combination with an HPV test. Discuss your test results, treatment options, and if necessary, the need for more tests with your health care provider. Vaccines Your health care provider may recommend certain vaccines, such as:  Influenza vaccine. This is recommended every year.  Tetanus, diphtheria, and acellular pertussis (Tdap, Td) vaccine. You may need a Td booster every 10 years.  Varicella vaccine. You may need this if you have not been vaccinated.  HPV vaccine. If you are 54 or younger, you may need three doses over 6 months.  Measles, mumps, and rubella (MMR) vaccine. You may need at least one dose of MMR. You may also need a second dose.  Pneumococcal 13-valent conjugate (PCV13) vaccine. You may need this if you have certain conditions and were not previously vaccinated.  Pneumococcal polysaccharide (PPSV23) vaccine. You may need one or two doses if you smoke cigarettes or if you have certain conditions.  Meningococcal vaccine. One dose is recommended if you are age  19-21 years and a Market researcher living in a residence hall, or if you have one of several medical conditions. You may also need additional booster doses.  Hepatitis A vaccine. You may need this if you have certain conditions or if you travel or work in places where you may be exposed to hepatitis A.  Hepatitis B vaccine. You may  need this if you have certain conditions or if you travel or work in places where you may be exposed to hepatitis B.  Haemophilus influenzae type b (Hib) vaccine. You may need this if you have certain risk factors. Talk to your health care provider about which screenings and vaccines you need and how often you need them. This information is not intended to replace advice given to you by your health care provider. Make sure you discuss any questions you have with your health care provider. Document Released: 07/18/2001 Document Revised: 01/02/2017 Document Reviewed: 03/23/2015 Elsevier Interactive Patient Education  2019 [employer].

## 2018-06-17 ENCOUNTER — Encounter: Payer: Self-pay | Admitting: *Deleted

## 2018-09-16 ENCOUNTER — Telehealth: Payer: Self-pay | Admitting: *Deleted

## 2018-09-16 MED ORDER — FLUCONAZOLE 150 MG PO TABS: 150.0000 mg | ORAL_TABLET | Freq: Once | ORAL | 1 refills | Status: AC

## 2018-09-16 NOTE — Telephone Encounter (Signed)
Pt called stating she feels she has a yeast infection. Complain of irritation, itching and a white discharge. Pt has had them in the past and uses the cream but states its not helping and would like the pill called in to her pharmacy. Will send in Diflucan to [facility], instructed patient that if its not any better to call back to office to be seen.

## 2018-11-14 ENCOUNTER — Other Ambulatory Visit: Payer: Self-pay

## 2018-11-14 ENCOUNTER — Ambulatory Visit (INDEPENDENT_AMBULATORY_CARE_PROVIDER_SITE_OTHER): Payer: Managed Care, Other (non HMO) | Admitting: Obstetrics and Gynecology

## 2018-11-14 ENCOUNTER — Encounter: Payer: Self-pay | Admitting: *Deleted

## 2018-11-14 ENCOUNTER — Encounter: Payer: Self-pay | Admitting: Obstetrics and Gynecology

## 2018-11-14 VITALS — BP 105/67 | HR 66 | Wt 122.0 lb

## 2018-11-14 DIAGNOSIS — Z3046 Encounter for surveillance of implantable subdermal contraceptive: Secondary | ICD-10-CM | POA: Diagnosis not present

## 2018-11-14 NOTE — Procedures (Signed)
Nexplanon Removal Procedure Note Patient amenorrheic on Nexplanon that was placed in 07/2017. She doesn't have any issues with it and just wants it out.  Prior to the procedure being performed, the patient (or guardian) was asked to state their full name, date of birth, type of procedure being performed and the exact location of the operative site. This information was then checked against the documentation in the patient's chart. Prior to the procedure being performed, a "time out" was performed by the physician that confirmed the correct patient, procedure and site.  After informed consent was obtained, the patient's right arm was examined and the Nexplanon rod was noted to be easily palpable. The area was cleaned with alcohol then local anesthesia was infiltrated with 3 ml of 1% lidocaine. The area was prepped with betadine. Using sterile technique, the Nexplanon device was brought to the incision site. The capsule was scrapped off with the scalpel, the Nexplanon grasped with hemostats, and easily removed; the removal site was hemostatic. The Nexplanon was inspected and noted to be intact.  A steri-strip and a pressure dressing was applied.  The patient tolerated the procedure well.  She chose to do nothing for birth control.   Durene Romans MD Attending Center for [facility])

## 2018-12-23 ENCOUNTER — Other Ambulatory Visit: Payer: Self-pay

## 2018-12-23 ENCOUNTER — Ambulatory Visit (INDEPENDENT_AMBULATORY_CARE_PROVIDER_SITE_OTHER): Payer: Managed Care, Other (non HMO) | Admitting: *Deleted

## 2018-12-23 VITALS — BP 97/64 | HR 61

## 2018-12-23 DIAGNOSIS — Z3201 Encounter for pregnancy test, result positive: Secondary | ICD-10-CM

## 2018-12-23 LAB — POCT URINE PREGNANCY: Preg Test, Ur: POSITIVE — AB

## 2018-12-23 NOTE — Progress Notes (Signed)
Patient seen and assessed by nursing staff during this encounter. I have reviewed the chart and agree with the documentation and plan.  Ugonna Anyanwu, MD 12/23/2018 4:11 PM    

## 2018-12-23 NOTE — Progress Notes (Signed)
Pt here for a UPT, had 2 positive UPT's at home. Pt had nexplanon removed on 6/11 and has not had a period and did not get periods while she had the implant.   UPT positive in office, unsure of dating. Will have pt come back in around 3 weeks for initial OB visit/viabilty scan.   Crosby Oyster, RN

## 2019-01-13 ENCOUNTER — Encounter: Payer: Managed Care, Other (non HMO) | Admitting: Family Medicine

## 2019-01-13 ENCOUNTER — Ambulatory Visit (INDEPENDENT_AMBULATORY_CARE_PROVIDER_SITE_OTHER): Payer: Managed Care, Other (non HMO) | Admitting: Family Medicine

## 2019-01-13 ENCOUNTER — Other Ambulatory Visit: Payer: Self-pay

## 2019-01-13 ENCOUNTER — Encounter: Payer: Self-pay | Admitting: Family Medicine

## 2019-01-13 VITALS — BP 114/70 | HR 73 | Wt 127.0 lb

## 2019-01-13 DIAGNOSIS — Z3A08 8 weeks gestation of pregnancy: Secondary | ICD-10-CM | POA: Diagnosis not present

## 2019-01-13 DIAGNOSIS — O3680X0 Pregnancy with inconclusive fetal viability, not applicable or unspecified: Secondary | ICD-10-CM

## 2019-01-13 DIAGNOSIS — Z3201 Encounter for pregnancy test, result positive: Secondary | ICD-10-CM | POA: Diagnosis not present

## 2019-01-13 DIAGNOSIS — Z789 Other specified health status: Secondary | ICD-10-CM | POA: Diagnosis not present

## 2019-01-13 NOTE — Progress Notes (Signed)
   Subjective:    Patient ID: Crystal Graves is a 33 y.o. female presenting with Follow-up  on 01/13/2019  HPI: Here for New OB. At time of viability scan, we see 7 wk CRL in the uterus and no FHR. She has had some cramping but no bleeding.  Review of Systems  Constitutional: Negative for chills and fever.  Respiratory: Negative for shortness of breath.   Cardiovascular: Negative for chest pain.  Gastrointestinal: Negative for abdominal pain, nausea and vomiting.  Genitourinary: Negative for dysuria.  Skin: Negative for rash.      Objective:    BP 114/70   Pulse 73   Wt 127 lb (57.6 kg)   BMI 20.50 kg/m  Physical Exam Constitutional:      General: She is not in acute distress.    Appearance: She is well-developed.  HENT:     Head: Normocephalic and atraumatic.  Eyes:     General: No scleral icterus. Neck:     Musculoskeletal: Neck supple.  Cardiovascular:     Rate and Rhythm: Normal rate.  Pulmonary:     Effort: Pulmonary effort is normal.  Abdominal:     Palpations: Abdomen is soft.  Skin:    General: Skin is warm and dry.  Neurological:     Mental Status: She is alert and oriented to person, place, and time.    TVUS - reveals SIUP, CRL measures 7 wk 4 days without flicker. Right ovary is visualized. Left ovary is not seen with certainty.     Assessment & Plan:   Problem List Items Addressed This Visit      Unprioritized   Language barrier, speaks Spanish only    Other Visit Diagnoses    Positive pregnancy test    -  Primary   Relevant Orders   US OB Comp Less 14 Wks   Pregnancy with inconclusive fetal viability, single or unspecified fetus       will get formal u/s for verification--advised of exp. mgmt, meds, office D and C if ab confirmed      Total face-to-face time with patient: 15 minutes. Over 50% of encounter was spent on counseling and coordination of care. Return in about 2 weeks (around 01/27/2019) for a follow-up.  Donnamae Jude 01/13/2019 2:57 PM

## 2019-01-13 NOTE — Progress Notes (Signed)
Here for viability scan

## 2019-01-14 ENCOUNTER — Ambulatory Visit: Payer: Managed Care, Other (non HMO)

## 2019-01-14 NOTE — Progress Notes (Signed)
This encounter was created in error - please disregard.

## 2019-01-15 ENCOUNTER — Other Ambulatory Visit: Payer: Self-pay

## 2019-01-15 ENCOUNTER — Ambulatory Visit (INDEPENDENT_AMBULATORY_CARE_PROVIDER_SITE_OTHER): Payer: Managed Care, Other (non HMO) | Admitting: Obstetrics & Gynecology

## 2019-01-15 ENCOUNTER — Ambulatory Visit (HOSPITAL_COMMUNITY)
Admission: RE | Admit: 2019-01-15 | Discharge: 2019-01-15 | Disposition: A | Discharge status: Home / Self Care | Payer: Managed Care, Other (non HMO) | Source: Ambulatory Visit | Attending: Family Medicine | Admitting: Family Medicine

## 2019-01-15 DIAGNOSIS — Z3201 Encounter for pregnancy test, result positive: Secondary | ICD-10-CM | POA: Diagnosis present

## 2019-01-15 DIAGNOSIS — Z30011 Encounter for initial prescription of contraceptive pills: Secondary | ICD-10-CM

## 2019-01-15 DIAGNOSIS — Z113 Encounter for screening for infections with a predominantly sexual mode of transmission: Secondary | ICD-10-CM | POA: Diagnosis not present

## 2019-01-15 DIAGNOSIS — Z124 Encounter for screening for malignant neoplasm of cervix: Secondary | ICD-10-CM

## 2019-01-15 DIAGNOSIS — O021 Missed abortion: Secondary | ICD-10-CM | POA: Diagnosis not present

## 2019-01-15 DIAGNOSIS — Z1151 Encounter for screening for human papillomavirus (HPV): Secondary | ICD-10-CM

## 2019-01-15 DIAGNOSIS — Z0000 Encounter for general adult medical examination without abnormal findings: Secondary | ICD-10-CM

## 2019-01-15 MED ORDER — MISOPROSTOL 200 MCG PO TABS: ORAL_TABLET | ORAL | 0 refills | Status: DC

## 2019-01-15 MED ORDER — IBUPROFEN 800 MG PO TABS: 800.0000 mg | ORAL_TABLET | Freq: Three times a day (TID) | ORAL | 1 refills | Status: DC | PRN

## 2019-01-15 MED ORDER — OXYCODONE-ACETAMINOPHEN 5-325 MG PO TABS: 1.0000 | ORAL_TABLET | Freq: Four times a day (QID) | ORAL | 0 refills | Status: DC | PRN

## 2019-01-15 MED ORDER — NORGESTREL-ETHINYL ESTRADIOL 0.3-30 MG-MCG PO TABS: 1.0000 | ORAL_TABLET | Freq: Every day | ORAL | 11 refills | Status: DC

## 2019-01-15 NOTE — Addendum Note (Signed)
Addended by: Alric Seton on: 01/15/2019 04:29 PM   Modules accepted: Orders

## 2019-01-15 NOTE — Progress Notes (Signed)
   Subjective:    Patient ID: Crystal Graves, female    DOB: June 09, 1985, 33 y.o.   MRN: [id number]  HPI 33 yo P3 here to discuss her u/s done today. She denies any bleeding, some occasional cramping. She had been using Nexplanon but had it removed but now she is not sure that she wants a pregnancy, wants contraception. She has used a total of 3 Nexplanons in the past. She wants OCPs now. She has used them in the past.  Review of Systems She has ASCUS + HR HPV in 2018 and CT in 2019    Objective:   Physical Exam        Assessment & Plan:  Preventative care- pap smear with cotesting Missed ab- offered watchful waiting versus cytotec She opts for cytotec Contraception- start Lo ovral today Pelvic rest for 4 weeks minimum

## 2019-01-15 NOTE — Progress Notes (Signed)
Pt here from ultrasound for results. Crystal Graves will see pt.

## 2019-01-15 NOTE — Addendum Note (Signed)
Addended by: Diona Foley on: 01/15/2019 04:31 PM   Modules accepted: Orders

## 2019-01-16 LAB — ABO/RH: Rh Factor: POSITIVE

## 2019-01-17 LAB — CYTOLOGY - PAP
Adequacy: ABSENT
Chlamydia: NEGATIVE
Diagnosis: NEGATIVE
HPV: NOT DETECTED
Neisseria Gonorrhea: NEGATIVE

## 2019-02-20 ENCOUNTER — Telehealth: Payer: Self-pay | Admitting: *Deleted

## 2019-02-20 DIAGNOSIS — O021 Missed abortion: Secondary | ICD-10-CM

## 2019-02-20 NOTE — Telephone Encounter (Signed)
Pt called stating she is still having some irregular bleeding after her miscarriage a month ago and has been on OCP's x 2 weeks and is also complaining of headaches. Informed pt that I would call a provider and call her back. Per Dr Ilda Basset schedule pt for an ultrasound to check for retained products and if pt has had sex to have her come in for a UPT if she has had sex since miscarriage.Pt verbalizes and understands, will come in for UPT tomorrow, she had sex this past Sunday.

## 2019-02-21 ENCOUNTER — Ambulatory Visit (INDEPENDENT_AMBULATORY_CARE_PROVIDER_SITE_OTHER): Payer: Managed Care, Other (non HMO) | Admitting: *Deleted

## 2019-02-21 ENCOUNTER — Other Ambulatory Visit: Payer: Self-pay

## 2019-02-21 ENCOUNTER — Other Ambulatory Visit: Payer: Self-pay | Admitting: *Deleted

## 2019-02-21 DIAGNOSIS — O021 Missed abortion: Secondary | ICD-10-CM

## 2019-02-21 DIAGNOSIS — Z3201 Encounter for pregnancy test, result positive: Secondary | ICD-10-CM

## 2019-02-21 LAB — POCT URINE PREGNANCY: Preg Test, Ur: POSITIVE — AB

## 2019-02-21 NOTE — Addendum Note (Signed)
Addended by: Crosby Oyster on: 02/21/2019 11:50 AM   Modules accepted: Orders

## 2019-02-21 NOTE — Progress Notes (Addendum)
Pt here to get a UPT due to her irregular bleeding after her SAB.   UPT in office showed faint positive line. Beta HCG drawn today.  Will let MD know, pt has follow up ultrasound schedule 9/24 to check for retained products.   Crosby Oyster, RN

## 2019-02-22 LAB — BETA HCG QUANT (REF LAB): hCG Quant: 186 m[IU]/mL

## 2019-02-24 ENCOUNTER — Other Ambulatory Visit: Payer: Self-pay

## 2019-02-24 ENCOUNTER — Telehealth: Payer: Self-pay | Admitting: *Deleted

## 2019-02-24 ENCOUNTER — Other Ambulatory Visit: Payer: Managed Care, Other (non HMO)

## 2019-02-24 ENCOUNTER — Encounter: Payer: Self-pay | Admitting: Obstetrics and Gynecology

## 2019-02-24 DIAGNOSIS — O021 Missed abortion: Secondary | ICD-10-CM

## 2019-02-24 DIAGNOSIS — Z3201 Encounter for pregnancy test, result positive: Secondary | ICD-10-CM | POA: Insufficient documentation

## 2019-02-24 NOTE — Telephone Encounter (Signed)
Called pt to let her know we need to repeat her lab prior to her ultrasound this week, pt will come in today.

## 2019-02-25 ENCOUNTER — Telehealth: Payer: Self-pay | Admitting: *Deleted

## 2019-02-25 LAB — BETA HCG QUANT (REF LAB): hCG Quant: 114 m[IU]/mL

## 2019-02-25 NOTE — Telephone Encounter (Signed)
Pt informed of decreasing HCG and to still go to ultrasound appointment this week and then to make an appointment with MD next week to go over all her results. Pt verbalizes and understands.

## 2019-02-26 NOTE — Progress Notes (Signed)
Patient seen and assessed by nursing staff during this encounter. I have reviewed the chart and agree with the documentation and plan.  Makynzie Dobesh, MD 02/26/2019 9:11 AM    

## 2019-02-27 ENCOUNTER — Ambulatory Visit (HOSPITAL_COMMUNITY)
Admission: RE | Admit: 2019-02-27 | Discharge: 2019-02-27 | Disposition: A | Discharge status: Home / Self Care | Payer: Managed Care, Other (non HMO) | Source: Ambulatory Visit | Attending: Obstetrics and Gynecology | Admitting: Obstetrics and Gynecology

## 2019-02-27 ENCOUNTER — Other Ambulatory Visit: Payer: Self-pay

## 2019-02-27 DIAGNOSIS — O021 Missed abortion: Secondary | ICD-10-CM | POA: Insufficient documentation

## 2019-02-28 ENCOUNTER — Telehealth: Payer: Self-pay | Admitting: *Deleted

## 2019-02-28 NOTE — Telephone Encounter (Signed)
Pt informed of ultrasound results.

## 2019-02-28 NOTE — Telephone Encounter (Signed)
-----   Message from Aletha Halim, MD sent at 02/27/2019  3:56 PM EDT ----- Can you let her know it looks like the pregnancy has completely passed? thanks

## 2019-03-03 ENCOUNTER — Telehealth: Payer: Self-pay | Admitting: *Deleted

## 2019-03-03 NOTE — Telephone Encounter (Signed)
Pt called concerned because she had had some heavy bright red bleeding that started yesterday and today she is having pain. Pt states bleeding is better today. Per Dr A, pt is probably having her period and the first one after a SAB can be heavier and have more cramping. Pt informed and advised that she take ibuprofen for the pain and we will see her on 10/1 for her follow up.

## 2019-03-06 ENCOUNTER — Ambulatory Visit (INDEPENDENT_AMBULATORY_CARE_PROVIDER_SITE_OTHER): Payer: Managed Care, Other (non HMO) | Admitting: Obstetrics and Gynecology

## 2019-03-06 ENCOUNTER — Encounter: Payer: Self-pay | Admitting: Obstetrics and Gynecology

## 2019-03-06 ENCOUNTER — Other Ambulatory Visit: Payer: Self-pay

## 2019-03-06 VITALS — BP 101/78 | HR 72 | Wt 124.5 lb

## 2019-03-06 DIAGNOSIS — Z5189 Encounter for other specified aftercare: Secondary | ICD-10-CM | POA: Diagnosis not present

## 2019-03-06 DIAGNOSIS — O039 Complete or unspecified spontaneous abortion without complication: Secondary | ICD-10-CM | POA: Diagnosis not present

## 2019-03-06 NOTE — Progress Notes (Signed)
Patient reports she is still bleeding.

## 2019-03-06 NOTE — Progress Notes (Signed)
Obstetrics and Gynecology Visit Return Patient Evaluation  Appointment Date: 03/06/2019  Primary Care Provider: Joellyn Rued [facility]: [facility]  Chief Complaint: follow up medical management of miscarriage  History of Present Illness:  Crystal Graves is a 33 y.o. s/p cytotec management of miscarriage in mid august and negative u/s follow up on 9/24. Patient states spotting stopped yesterday. No fevers, chills, pain. Patient taking combined OCPs  Review of Systems: as noted in the History of Present Illness. Medications:  Crystal Graves had no medications administered during this visit. Current Outpatient Medications  Medication Sig Dispense Refill  . norgestrel-ethinyl estradiol (LO/OVRAL) 0.3-30 MG-MCG tablet Take 1 tablet by mouth daily. 1 Package 11  . ibuprofen (ADVIL) 800 MG tablet Take 1 tablet (800 mg total) by mouth every 8 (eight) hours as needed. (Patient not taking: Reported on 03/06/2019) 60 tablet 1   No current facility-administered medications for this visit.     Allergies: has No Known Allergies.  Physical Exam:  BP 101/78   Pulse 72   Wt 124 lb 8 oz (56.5 kg)   BMI 20.09 kg/m  Body mass index is 20.09 kg/m. General appearance: Well nourished, well developed female in no acute distress.  Neuro/Psych:  Normal mood and affect.     Assessment: pt doing well  Plan:  1. Follow-up visit after miscarriage D/w her re: OCP use  Interpreter used  RTC: PRN  Durene Romans MD Attending Center for [facility])

## 2019-06-02 ENCOUNTER — Ambulatory Visit: Payer: Managed Care, Other (non HMO) | Admitting: Obstetrics and Gynecology

## 2019-06-11 ENCOUNTER — Other Ambulatory Visit: Payer: Self-pay | Admitting: Primary Care

## 2019-06-11 DIAGNOSIS — Z0000 Encounter for general adult medical examination without abnormal findings: Secondary | ICD-10-CM

## 2019-06-12 ENCOUNTER — Ambulatory Visit (INDEPENDENT_AMBULATORY_CARE_PROVIDER_SITE_OTHER): Payer: Managed Care, Other (non HMO) | Admitting: Obstetrics and Gynecology

## 2019-06-12 ENCOUNTER — Other Ambulatory Visit: Payer: Self-pay

## 2019-06-12 VITALS — BP 115/73 | HR 63

## 2019-06-12 DIAGNOSIS — Z3202 Encounter for pregnancy test, result negative: Secondary | ICD-10-CM | POA: Diagnosis not present

## 2019-06-12 DIAGNOSIS — Z30017 Encounter for initial prescription of implantable subdermal contraceptive: Secondary | ICD-10-CM

## 2019-06-12 LAB — POCT URINE PREGNANCY: Preg Test, Ur: NEGATIVE

## 2019-06-12 NOTE — Progress Notes (Signed)
Patient seen and assessed by nursing staff during this encounter. I have reviewed the chart and agree with the documentation and plan.  Waterford Bing, MD 06/12/2019 3:59 PM

## 2019-06-12 NOTE — Progress Notes (Signed)
Pt would like to get a nexplanon, has had unprotected sex in the last 2 weeks, will do UPT today and have pt return in 2 weeks to repeat UPT and if negative place nexplanon.   UPT in office today-negative

## 2019-06-17 ENCOUNTER — Telehealth: Payer: Self-pay

## 2019-06-17 NOTE — Telephone Encounter (Signed)
Opened in error

## 2019-06-18 ENCOUNTER — Other Ambulatory Visit: Payer: Self-pay

## 2019-06-18 ENCOUNTER — Other Ambulatory Visit (INDEPENDENT_AMBULATORY_CARE_PROVIDER_SITE_OTHER): Payer: Managed Care, Other (non HMO)

## 2019-06-18 DIAGNOSIS — Z0000 Encounter for general adult medical examination without abnormal findings: Secondary | ICD-10-CM

## 2019-06-18 LAB — CBC
HCT: 41.7 % (ref 36.0–46.0)
Hemoglobin: 14 g/dL (ref 12.0–15.0)
MCHC: 33.6 g/dL (ref 30.0–36.0)
MCV: 89.4 fl (ref 78.0–100.0)
Platelets: 241 10*3/uL (ref 150.0–400.0)
RBC: 4.66 Mil/uL (ref 3.87–5.11)
RDW: 13.1 % (ref 11.5–15.5)
WBC: 4.8 10*3/uL (ref 4.0–10.5)

## 2019-06-18 LAB — COMPREHENSIVE METABOLIC PANEL
ALT: 17 U/L (ref 0–35)
AST: 19 U/L (ref 0–37)
Albumin: 4.4 g/dL (ref 3.5–5.2)
Alkaline Phosphatase: 64 U/L (ref 39–117)
BUN: 14 mg/dL (ref 6–23)
CO2: 29 mEq/L (ref 19–32)
Calcium: 9.5 mg/dL (ref 8.4–10.5)
Chloride: 102 mEq/L (ref 96–112)
Creatinine, Ser: 0.68 mg/dL (ref 0.40–1.20)
GFR: 100.22 mL/min (ref >60.00)
Glucose, Bld: 81 mg/dL (ref 70–99)
Potassium: 3.8 mEq/L (ref 3.5–5.1)
Sodium: 138 mEq/L (ref 135–145)
Total Bilirubin: 0.8 mg/dL (ref 0.2–1.2)
Total Protein: 7.5 g/dL (ref 6.0–8.3)

## 2019-06-18 LAB — LIPID PANEL
Cholesterol: 167 mg/dL (ref 0–200)
HDL: 72.6 mg/dL (ref >39.00)
LDL Cholesterol: 82 mg/dL (ref 0–99)
NonHDL: 94.56
Total CHOL/HDL Ratio: 2
Triglycerides: 64 mg/dL (ref 0.0–149.0)
VLDL: 12.8 mg/dL (ref 0.0–40.0)

## 2019-06-25 ENCOUNTER — Encounter: Payer: Self-pay | Admitting: *Deleted

## 2019-06-25 ENCOUNTER — Ambulatory Visit (INDEPENDENT_AMBULATORY_CARE_PROVIDER_SITE_OTHER): Payer: Managed Care, Other (non HMO) | Admitting: Primary Care

## 2019-06-25 ENCOUNTER — Other Ambulatory Visit: Payer: Self-pay

## 2019-06-25 ENCOUNTER — Encounter: Payer: Self-pay | Admitting: Primary Care

## 2019-06-25 VITALS — BP 96/70 | HR 70 | Temp 97.4°F | Ht 66.0 in | Wt 124.5 lb

## 2019-06-25 DIAGNOSIS — N871 Moderate cervical dysplasia: Secondary | ICD-10-CM | POA: Diagnosis not present

## 2019-06-25 DIAGNOSIS — Z0000 Encounter for general adult medical examination without abnormal findings: Secondary | ICD-10-CM

## 2019-06-25 NOTE — Progress Notes (Signed)
Subjective:    Patient ID: Crystal Graves, female    DOB: 31-Jan-1986, 34 y.o.   MRN: [id number]  HPI  This visit occurred during the SARS-CoV-2 public health emergency.  Safety protocols were in place, including screening questions prior to the visit, additional usage of staff PPE, and extensive cleaning of exam room while observing appropriate contact time as indicated for disinfecting solutions.   Crystal Graves is a 34 year old female who presents today for complete physical.  Immunizations: -Tetanus: Completed in 2015 -Influenza: Completed this season   Diet: She endorses a poor diet. Exercise: She is not exercising   Eye exam: No recent exam Dental exam: No recent exam  Pap Smear: Completed in 2020, follows with GYN  BP Readings from Last 3 Encounters:  06/25/19 96/70  06/12/19 115/73  03/06/19 101/78     Review of Systems  Constitutional: Negative for unexpected weight change.  HENT: Negative for rhinorrhea.   Respiratory: Negative for cough and shortness of breath.   Cardiovascular: Negative for chest pain.  Gastrointestinal: Negative for constipation and diarrhea.  Genitourinary: Negative for difficulty urinating and menstrual problem.  Musculoskeletal: Negative for arthralgias and myalgias.  Skin: Negative for rash.  Allergic/Immunologic: Negative for environmental allergies.  Neurological: Negative for dizziness, numbness and headaches.  Psychiatric/Behavioral: The patient is not nervous/anxious.        Past Medical History:  Diagnosis Date  . ASCUS with positive high risk HPV cervical   . Chlamydia infection 06/29/2017     Social History   Socioeconomic History  . Marital status: Single    Spouse name: Not on file  . Number of children: Not on file  . Years of education: Not on file  . Highest education level: Not on file  Occupational History  . Not on file  Tobacco Use  . Smoking status: Never Smoker  . Smokeless tobacco: Never Used    Substance and Sexual Activity  . Alcohol use: Yes    Alcohol/week: 0.0 standard drinks    Comment: Occasional  . Drug use: No  . Sexual activity: Yes    Birth control/protection: Implant  Other Topics Concern  . Not on file  Social History Narrative   Single.   2 children.   Works as a Furniture conservator/restorer.   She enjoys dancing, going to the movies.    Social Determinants of Health   Financial Resource Strain:   . Difficulty of Paying Living Expenses: Not on file  Food Insecurity:   . Worried About Charity fundraiser in the Last Year: Not on file  . Ran Out of Food in the Last Year: Not on file  Transportation Graves:   . Lack of Transportation (Medical): Not on file  . Lack of Transportation (Non-Medical): Not on file  Physical Activity:   . Days of Exercise per Week: Not on file  . Minutes of Exercise per Session: Not on file  Stress:   . Feeling of Stress : Not on file  Social Connections:   . Frequency of Communication with Friends and Family: Not on file  . Frequency of Social Gatherings with Friends and Family: Not on file  . Attends Religious Services: Not on file  . Active Member of Clubs or Organizations: Not on file  . Attends Archivist Meetings: Not on file  . Marital Status: Not on file  Intimate Partner Violence:   . Fear of Current or Ex-Partner: Not on file  . Emotionally Abused: Not  on file  . Physically Abused: Not on file  . Sexually Abused: Not on file    Past Surgical History:  Procedure Laterality Date  . COLPOSCOPY W/ BIOPSY / CURETTAGE  11/10/2014   Done for LGSIL pap smear  . GYNECOLOGIC CRYOSURGERY  12/10/2014   Done for CIN II (colposcopy pathology after LGSIL pap)    Family History  Problem Relation Age of Onset  . Hypertension Father     No Known Allergies  No current outpatient medications on file prior to visit.   No current facility-administered medications on file prior to visit.    BP 96/70   Pulse 70   Temp (!) 97.4  F (36.3 C) (Temporal)   Ht 5\' 6"  (1.676 m)   Wt 124 lb 8 oz (56.5 kg)   LMP 05/28/2019 (Approximate)   SpO2 97%   BMI 20.09 kg/m    Objective:   Physical Exam  Constitutional: She is oriented to person, place, and time. She appears well-nourished.  HENT:  Right Ear: Tympanic membrane and ear canal normal.  Left Ear: Tympanic membrane and ear canal normal.  Mouth/Throat: Oropharynx is clear and moist.  Eyes: Pupils are equal, round, and reactive to light. EOM are normal.  Cardiovascular: Normal rate and regular rhythm.  Respiratory: Effort normal and breath sounds normal.  GI: Soft. Bowel sounds are normal. There is no abdominal tenderness.  Musculoskeletal:        General: Normal range of motion.     Cervical back: Neck supple.  Neurological: She is alert and oriented to person, place, and time. No cranial nerve deficit.  Reflex Scores:      Patellar reflexes are 2+ on the right side and 2+ on the left side. Skin: Skin is warm and dry.  Psychiatric: She has a normal mood and affect.           Assessment & Plan:

## 2019-06-25 NOTE — Assessment & Plan Note (Signed)
Follows with GYN, pap smear from 2020 negative.

## 2019-06-25 NOTE — Patient Instructions (Signed)
It's important to improve your diet by reducing consumption of fast food, fried food, processed snack foods, sugary drinks. Increase consumption of fresh vegetables and fruits, whole grains, water.  Ensure you are drinking 64 ounces of water daily.  Start exercising. You should be getting 150 minutes of moderate intensity exercise weekly.  It was a pleasure to see you today!   Preventive Care 49-33 Years Old, Female Preventive care refers to visits with your health care provider and lifestyle choices that can promote health and wellness. This includes:  A yearly physical exam. This may also be called an annual well check.  Regular dental visits and eye exams.  Immunizations.  Screening for certain conditions.  Healthy lifestyle choices, such as eating a healthy diet, getting regular exercise, not using drugs or products that contain nicotine and tobacco, and limiting alcohol use. What can I expect for my preventive care visit? Physical exam Your health care provider will check your:  Height and weight. This may be used to calculate body mass index (BMI), which tells if you are at a healthy weight.  Heart rate and blood pressure.  Skin for abnormal spots. Counseling Your health care provider may ask you questions about your:  Alcohol, tobacco, and drug use.  Emotional well-being.  Home and relationship well-being.  Sexual activity.  Eating habits.  Work and work Statistician.  Method of birth control.  Menstrual cycle.  Pregnancy history. What immunizations do I need?  Influenza (flu) vaccine  This is recommended every year. Tetanus, diphtheria, and pertussis (Tdap) vaccine  You may need a Td booster every 10 years. Varicella (chickenpox) vaccine  You may need this if you have not been vaccinated. Human papillomavirus (HPV) vaccine  If recommended by your health care provider, you may need three doses over 6 months. Measles, mumps, and rubella (MMR)  vaccine  You may need at least one dose of MMR. You may also need a second dose. Meningococcal conjugate (MenACWY) vaccine  One dose is recommended if you are age 96-21 years and a first-year college student living in a residence hall, or if you have one of several medical conditions. You may also need additional booster doses. Pneumococcal conjugate (PCV13) vaccine  You may need this if you have certain conditions and were not previously vaccinated. Pneumococcal polysaccharide (PPSV23) vaccine  You may need one or two doses if you smoke cigarettes or if you have certain conditions. Hepatitis A vaccine  You may need this if you have certain conditions or if you travel or work in places where you may be exposed to hepatitis A. Hepatitis B vaccine  You may need this if you have certain conditions or if you travel or work in places where you may be exposed to hepatitis B. Haemophilus influenzae type b (Hib) vaccine  You may need this if you have certain conditions. You may receive vaccines as individual doses or as more than one vaccine together in one shot (combination vaccines). Talk with your health care provider about the risks and benefits of combination vaccines. What tests do I need?  Blood tests  Lipid and cholesterol levels. These may be checked every 5 years starting at age 58.  Hepatitis C test.  Hepatitis B test. Screening  Diabetes screening. This is done by checking your blood sugar (glucose) after you have not eaten for a while (fasting).  Sexually transmitted disease (STD) testing.  BRCA-related cancer screening. This may be done if you have a family history of breast, ovarian,  tubal, or peritoneal cancers.  Pelvic exam and Pap test. This may be done every 3 years starting at age 21. Starting at age 30, this may be done every 5 years if you have a Pap test in combination with an HPV test. Talk with your health care provider about your test results, treatment  options, and if necessary, the need for more tests. Follow these instructions at home: Eating and drinking   Eat a diet that includes fresh fruits and vegetables, whole grains, lean protein, and low-fat dairy.  Take vitamin and mineral supplements as recommended by your health care provider.  Do not drink alcohol if: ? Your health care provider tells you not to drink. ? You are pregnant, may be pregnant, or are planning to become pregnant.  If you drink alcohol: ? Limit how much you have to 0-1 drink a day. ? Be aware of how much alcohol is in your drink. In the [place] one drink equals one 12 oz bottle of beer (355 mL), one 5 oz glass of wine (148 mL), or one 1 oz glass of hard liquor (44 mL). Lifestyle  Take daily care of your teeth and gums.  Stay active. Exercise for at least 30 minutes on 5 or more days each week.  Do not use any products that contain nicotine or tobacco, such as cigarettes, e-cigarettes, and chewing tobacco. If you need help quitting, ask your health care provider.  If you are sexually active, practice safe sex. Use a condom or other form of birth control (contraception) in order to prevent pregnancy and STIs (sexually transmitted infections). If you plan to become pregnant, see your health care provider for a preconception visit. What's next?  Visit your health care provider once a year for a well check visit.  Ask your health care provider how often you should have your eyes and teeth checked.  Stay up to date on all vaccines. This information is not intended to replace advice given to you by your health care provider. Make sure you discuss any questions you have with your health care provider. Document Revised: 01/31/2018 Document Reviewed: 01/31/2018 Elsevier Patient Education  2020 Elsevier Inc.  

## 2019-06-25 NOTE — Assessment & Plan Note (Signed)
Influenza vaccinations UTD. Pap smear UTD. Encouraged a healthy diet, regular exercise. Exam today unremarkable. Labs reviewed.

## 2019-06-26 ENCOUNTER — Ambulatory Visit: Payer: Managed Care, Other (non HMO) | Admitting: Obstetrics and Gynecology

## 2019-10-20 ENCOUNTER — Ambulatory Visit (INDEPENDENT_AMBULATORY_CARE_PROVIDER_SITE_OTHER): Payer: Managed Care, Other (non HMO) | Admitting: *Deleted

## 2019-10-20 ENCOUNTER — Other Ambulatory Visit: Payer: Self-pay

## 2019-10-20 VITALS — BP 127/87 | HR 82

## 2019-10-20 DIAGNOSIS — Z3201 Encounter for pregnancy test, result positive: Secondary | ICD-10-CM | POA: Diagnosis not present

## 2019-10-20 LAB — POCT URINE PREGNANCY: Preg Test, Ur: POSITIVE — AB

## 2019-10-20 NOTE — Progress Notes (Signed)
Patient seen and assessed by nursing staff.  Agree with documentation and plan.  

## 2019-10-20 NOTE — Progress Notes (Signed)
Pt here today for UPT.  LMP was 09/19/2019, pt had positive test at home.   UPT in office was positive. Pt is 4 weeks 3 days and will follow up in about 7 weeks for initial OB visit.

## 2019-12-01 ENCOUNTER — Other Ambulatory Visit: Payer: Self-pay

## 2019-12-01 ENCOUNTER — Encounter: Payer: Self-pay | Admitting: Obstetrics & Gynecology

## 2019-12-01 ENCOUNTER — Ambulatory Visit (INDEPENDENT_AMBULATORY_CARE_PROVIDER_SITE_OTHER): Payer: Managed Care, Other (non HMO) | Admitting: Obstetrics & Gynecology

## 2019-12-01 ENCOUNTER — Other Ambulatory Visit (HOSPITAL_COMMUNITY)
Admission: RE | Admit: 2019-12-01 | Discharge: 2019-12-01 | Disposition: A | Discharge status: Home / Self Care | Payer: Managed Care, Other (non HMO) | Source: Ambulatory Visit | Attending: Obstetrics & Gynecology | Admitting: Obstetrics & Gynecology

## 2019-12-01 VITALS — BP 122/75 | HR 80 | Wt 127.0 lb

## 2019-12-01 DIAGNOSIS — Z3490 Encounter for supervision of normal pregnancy, unspecified, unspecified trimester: Secondary | ICD-10-CM

## 2019-12-01 DIAGNOSIS — Z3A10 10 weeks gestation of pregnancy: Secondary | ICD-10-CM

## 2019-12-01 DIAGNOSIS — Z3491 Encounter for supervision of normal pregnancy, unspecified, first trimester: Secondary | ICD-10-CM | POA: Diagnosis not present

## 2019-12-01 DIAGNOSIS — Z87410 Personal history of cervical dysplasia: Secondary | ICD-10-CM | POA: Diagnosis not present

## 2019-12-01 DIAGNOSIS — N871 Moderate cervical dysplasia: Secondary | ICD-10-CM

## 2019-12-01 NOTE — Progress Notes (Signed)
DATING AND VIABILITY SONOGRAM   Crystal Graves [name] is a 34 y.o. year old [phone] with LMP Patient's last menstrual period was 09/19/2019 (exact date). which would correlate to  [redacted]w[redacted]d weeks gestation.  She has regular menstrual cycles.   She is here today for a confirmatory initial sonogram.    GESTATION: SINGLETON yes     FETAL ACTIVITY:          Heart rate    164          The fetus is active.   GESTATIONAL AGE AND  BIOMETRICS:  Gestational criteria: Estimated Date of Delivery: 06/25/20 by LMP now at [redacted]w[redacted]d  Previous Scans:0  GESTATIONAL SAC            mm         weeks  CROWN RUMP LENGTH         2.95   mm         9.5 weeks                                                   AVERAGE EGA(BY THIS SCAN):  9.5 weeks  WORKING EDD( LMP ):  06/25/2020     TECHNICIAN COMMENTS:  Patient informed that the ultrasound is considered a limited obstetric ultrasound and is not intended to be a complete ultrasound exam. Patient also informed that the ultrasound is not being completed with the intent of assessing for fetal or placental anomalies or any pelvic abnormalities. Explained that the purpose of today's ultrasound is to assess for fetal heart rate. Patient acknowledges the purpose of the exam and the limitations of the study.      A copy of this report including all images has been saved and backed up to a second source for retrieval if needed. All measures and details of the anatomical scan, placentation, fluid volume and pelvic anatomy are contained in that report.  Scheryl Marten 12/01/2019 3:53 PM

## 2019-12-01 NOTE — Progress Notes (Signed)
  Subjective:    Crystal Graves is a [id number] [redacted]w[redacted]d being seen today for her first obstetrical visit.  Her obstetrical history is significant for history of LEEP, CIN 1. Patient does intend to breast feed. Pregnancy history fully reviewed.  Patient reports no complaints.  Vitals:   12/01/19 1536  BP: 122/75  Pulse: 80  Weight: 127 lb (57.6 kg)    HISTORY: OB History  Gravida Para Term Preterm AB Living  4 2 2  0 1 2  SAB TAB Ectopic Multiple Live Births  1 0 0 0 2    # Outcome Date GA Lbr Len/2nd Weight Sex Delivery Anes PTL Lv  4 Current           3 SAB 02/2019          2 Term 06/28/10    M Vag-Spont   LIV  1 Term 09/15/05    09/17/05   LIV   Past Medical History:  Diagnosis Date  . ASCUS with positive high risk HPV cervical   . Chlamydia infection 06/29/2017   Past Surgical History:  Procedure Laterality Date  . COLPOSCOPY W/ BIOPSY / CURETTAGE  11/10/2014   Done for LGSIL pap smear  . GYNECOLOGIC CRYOSURGERY  12/10/2014   Done for CIN II (colposcopy pathology after LGSIL pap)   Family History  Problem Relation Age of Onset  . Hypertension Father      Exam    Uterus:     Pelvic Exam:    Perineum: No Hemorrhoids   Vulva: normal   Vagina:  normal mucosa   pH:     Cervix: no lesions   Adnexa: normal adnexa   Bony Pelvis: average  System: Breast:  normal appearance, no masses or tenderness   Skin: normal coloration and turgor, no rashes    Neurologic: oriented, normal mood   Extremities: normal strength, tone, and muscle mass   HEENT PERRLA, sclera clear, anicteric, neck supple with midline trachea and thyroid without masses   Mouth/Teeth mucous membranes moist, pharynx normal without lesions and dental hygiene good   Neck supple and no masses   Cardiovascular: regular rate and rhythm, no murmurs or gallops   Respiratory:  appears well, vitals normal, no respiratory distress, acyanotic, normal RR, chest clear, no wheezing, crepitations,  rhonchi, normal symmetric air entry   Abdomen: soft, non-tender; bowel sounds normal; no masses,  no organomegaly   Urinary: urethral meatus normal      Assessment:    Pregnancy: 02/10/2015 Patient Active Problem List   Diagnosis Date Noted  . Memory problem 06/14/2018  . Preventative health care 07/10/2016  . Moderate dysplasia of cervix (CIN II) 11/11/2014  . Language barrier, speaks Spanish only 11/10/2014        Plan:     Initial labs drawn. Prenatal vitamins. Problem list reviewed and updated. Genetic Screening discussed  Ordered  Ultrasound discussed; fetal survey: ordered.  Follow up in 4 weeks. 50% of 30 min visit spent on counseling and coordination of care.     01/10/2015 12/01/2019

## 2019-12-01 NOTE — Patient Instructions (Signed)
First Trimester of Pregnancy  The first trimester of pregnancy is from week 1 until the end of week 13 (months 1 through 3). During this time, your baby will begin to develop inside you. At 6-8 weeks, the eyes and face are formed, and the heartbeat can be seen on ultrasound. At the end of 12 weeks, all the baby's organs are formed. Prenatal care is all the medical care you receive before the birth of your baby. Make sure you get good prenatal care and follow all of your doctor's instructions. Follow these instructions at home: Medicines  Take over-the-counter and prescription medicines only as told by your doctor. Some medicines are safe and some medicines are not safe during pregnancy.  Take a prenatal vitamin that contains at least 600 micrograms (mcg) of folic acid.  If you have trouble pooping (constipation), take medicine that will make your stool soft (stool softener) if your doctor approves. Eating and drinking   Eat regular, healthy meals.  Your doctor will tell you the amount of weight gain that is right for you.  Avoid raw meat and uncooked cheese.  If you feel sick to your stomach (nauseous) or throw up (vomit): ? Eat 4 or 5 small meals a day instead of 3 large meals. ? Try eating a few soda crackers. ? Drink liquids between meals instead of during meals.  To prevent constipation: ? Eat foods that are high in fiber, like fresh fruits and vegetables, whole grains, and beans. ? Drink enough fluids to keep your pee (urine) clear or pale yellow. Activity  Exercise only as told by your doctor. Stop exercising if you have cramps or pain in your lower belly (abdomen) or low back.  Do not exercise if it is too hot, too humid, or if you are in a place of great height (high altitude).  Try to avoid standing for long periods of time. Move your legs often if you must stand in one place for a long time.  Avoid heavy lifting.  Wear low-heeled shoes. Sit and stand up  straight.  You can have sex unless your doctor tells you not to. Relieving pain and discomfort  Wear a good support bra if your breasts are sore.  Take warm water baths (sitz baths) to soothe pain or discomfort caused by hemorrhoids. Use hemorrhoid cream if your doctor says it is okay.  Rest with your legs raised if you have leg cramps or low back pain.  If you have puffy, bulging veins (varicose veins) in your legs: ? Wear support hose or compression stockings as told by your doctor. ? Raise (elevate) your feet for 15 minutes, 3-4 times a day. ? Limit salt in your food. Prenatal care  Schedule your prenatal visits by the twelfth week of pregnancy.  Write down your questions. Take them to your prenatal visits.  Keep all your prenatal visits as told by your doctor. This is important. Safety  Wear your seat belt at all times when driving.  Make a list of emergency phone numbers. The list should include numbers for family, friends, the hospital, and police and fire departments. General instructions  Ask your doctor for a referral to a local prenatal class. Begin classes no later than at the start of month 6 of your pregnancy.  Ask for help if you need counseling or if you need help with nutrition. Your doctor can give you advice or tell you where to go for help.  Do not use hot tubs, steam   rooms, or saunas.  Do not douche or use tampons or scented sanitary pads.  Do not cross your legs for long periods of time.  Avoid all herbs and alcohol. Avoid drugs that are not approved by your doctor.  Do not use any tobacco products, including cigarettes, chewing tobacco, and electronic cigarettes. If you need help quitting, ask your doctor. You may get counseling or other support to help you quit.  Avoid cat litter boxes and soil used by cats. These carry germs that can cause birth defects in the baby and can cause a loss of your baby (miscarriage) or stillbirth.  Visit your dentist.  At home, brush your teeth with a soft toothbrush. Be gentle when you floss. Contact a doctor if:  You are dizzy.  You have mild cramps or pressure in your lower belly.  You have a nagging pain in your belly area.  You continue to feel sick to your stomach, you throw up, or you have watery poop (diarrhea).  You have a bad smelling fluid coming from your vagina.  You have pain when you pee (urinate).  You have increased puffiness (swelling) in your face, hands, legs, or ankles. Get help right away if:  You have a fever.  You are leaking fluid from your vagina.  You have spotting or bleeding from your vagina.  You have very bad belly cramping or pain.  You gain or lose weight rapidly.  You throw up blood. It may look like coffee grounds.  You are around people who have German measles, fifth disease, or chickenpox.  You have a very bad headache.  You have shortness of breath.  You have any kind of trauma, such as from a fall or a car accident. Summary  The first trimester of pregnancy is from week 1 until the end of week 13 (months 1 through 3).  To take care of yourself and your unborn baby, you will need to eat healthy meals, take medicines only if your doctor tells you to do so, and do activities that are safe for you and your baby.  Keep all follow-up visits as told by your doctor. This is important as your doctor will have to ensure that your baby is healthy and growing well. This information is not intended to replace advice given to you by your health care provider. Make sure you discuss any questions you have with your health care provider. Document Revised: 09/12/2018 Document Reviewed: 05/30/2016 Elsevier Patient Education  2020 Elsevier Inc.  

## 2019-12-02 LAB — CBC/D/PLT+RPR+RH+ABO+RUB AB...
Antibody Screen: NEGATIVE
Basophils Absolute: 0 10*3/uL (ref 0.0–0.2)
Basos: 0 %
EOS (ABSOLUTE): 0.3 10*3/uL (ref 0.0–0.4)
Eos: 4 %
HCV Ab: <0.1 s/co ratio (ref 0.0–0.9)
HIV Screen 4th Generation wRfx: NONREACTIVE
Hematocrit: 39.5 % (ref 34.0–46.6)
Hemoglobin: 12.8 g/dL (ref 11.1–15.9)
Hepatitis B Surface Ag: NEGATIVE
Immature Grans (Abs): 0 10*3/uL (ref 0.0–0.1)
Immature Granulocytes: 0 %
Lymphocytes Absolute: 1.8 10*3/uL (ref 0.7–3.1)
Lymphs: 23 %
MCH: 29.8 pg (ref 26.6–33.0)
MCHC: 32.4 g/dL (ref 31.5–35.7)
MCV: 92 fL (ref 79–97)
Monocytes Absolute: 0.4 10*3/uL (ref 0.1–0.9)
Monocytes: 5 %
Neutrophils Absolute: 5.1 10*3/uL (ref 1.4–7.0)
Neutrophils: 68 %
Platelets: 245 10*3/uL (ref 150–450)
RBC: 4.29 x10E6/uL (ref 3.77–5.28)
RDW: 12.5 % (ref 11.7–15.4)
RPR Ser Ql: NONREACTIVE
Rh Factor: POSITIVE
Rubella Antibodies, IGG: 2.25 index (ref >0.99)
WBC: 7.6 10*3/uL (ref 3.4–10.8)

## 2019-12-02 LAB — HCV INTERPRETATION

## 2019-12-03 LAB — GC/CHLAMYDIA PROBE AMP (~~LOC~~) NOT AT ARMC
Chlamydia: NEGATIVE
Comment: NEGATIVE
Comment: NORMAL
Neisseria Gonorrhea: NEGATIVE

## 2019-12-03 LAB — CULTURE, OB URINE

## 2019-12-03 LAB — URINE CULTURE, OB REFLEX

## 2019-12-29 ENCOUNTER — Other Ambulatory Visit: Payer: Self-pay

## 2019-12-29 ENCOUNTER — Encounter: Payer: Self-pay | Admitting: Obstetrics & Gynecology

## 2019-12-29 ENCOUNTER — Ambulatory Visit (INDEPENDENT_AMBULATORY_CARE_PROVIDER_SITE_OTHER): Payer: Managed Care, Other (non HMO) | Admitting: Obstetrics & Gynecology

## 2019-12-29 VITALS — BP 103/66 | HR 72 | Wt 129.0 lb

## 2019-12-29 DIAGNOSIS — Z3482 Encounter for supervision of other normal pregnancy, second trimester: Secondary | ICD-10-CM

## 2019-12-29 DIAGNOSIS — Z3480 Encounter for supervision of other normal pregnancy, unspecified trimester: Secondary | ICD-10-CM | POA: Insufficient documentation

## 2019-12-29 DIAGNOSIS — Z3A14 14 weeks gestation of pregnancy: Secondary | ICD-10-CM

## 2019-12-29 NOTE — Progress Notes (Signed)
   PRENATAL VISIT NOTE  Subjective:  Crystal Graves is a 34 y.o. [phone] at [redacted]w[redacted]d being seen today for ongoing prenatal care.  She is currently monitored for the following issues for this low-risk pregnancy and has Moderate dysplasia of cervix (CIN II); Preventative health care; Memory problem; and Supervision of other normal pregnancy, antepartum on their problem list.  Patient reports no complaints.  Contractions: Not present. Vag. Bleeding: None.  Movement: Absent. Denies leaking of fluid.   The following portions of the patient's history were reviewed and updated as appropriate: allergies, current medications, past family history, past medical history, past social history, past surgical history and problem list.   Objective:   Vitals:   12/29/19 1527  BP: 103/66  Pulse: 72  Weight: 129 lb (58.5 kg)    Fetal Status: Fetal Heart Rate (bpm): 161   Movement: Absent     General:  Alert, oriented and cooperative. Patient is in no acute distress.  Skin: Skin is warm and dry. No rash noted.   Cardiovascular: Normal heart rate noted  Respiratory: Normal respiratory effort, no problems with respiration noted  Abdomen: Soft, gravid, appropriate for gestational age.  Pain/Pressure: Absent     Pelvic: Cervical exam deferred        Extremities: Normal range of motion.     Mental Status: Normal mood and affect. Normal behavior. Normal judgment and thought content.   Assessment and Plan:  Pregnancy: [id number] at [redacted]w[redacted]d 1. [redacted] weeks gestation of pregnancy 2. Supervision of other normal pregnancy, antepartum Initial labs reviewed, all normal. Declined genetic testing. Anatomy scan ordered. - [place] MFM OB COMP + 14 WK; Future No other complaints or concerns.  Routine obstetric precautions reviewed. Please refer to After Visit Summary for other counseling recommendations.   Return in about 4 weeks (around 01/26/2020) for OFFICE OB Visit.  Future Appointments  Date Time Provider  Department Center  01/26/2020  3:00 PM [place] Bing, MD [facility]    [name], MD

## 2019-12-29 NOTE — Patient Instructions (Signed)

## 2020-01-26 ENCOUNTER — Other Ambulatory Visit: Payer: Self-pay

## 2020-01-26 ENCOUNTER — Ambulatory Visit (INDEPENDENT_AMBULATORY_CARE_PROVIDER_SITE_OTHER): Payer: Managed Care, Other (non HMO) | Admitting: Obstetrics and Gynecology

## 2020-01-26 VITALS — BP 106/68 | HR 66 | Wt 133.0 lb

## 2020-01-26 DIAGNOSIS — Z3480 Encounter for supervision of other normal pregnancy, unspecified trimester: Secondary | ICD-10-CM

## 2020-01-26 DIAGNOSIS — Z3A18 18 weeks gestation of pregnancy: Secondary | ICD-10-CM

## 2020-01-26 NOTE — Progress Notes (Signed)
Prenatal Visit Note Date: 01/26/2020 Clinic: [facility]  Subjective:  Crystal Graves is a 34 y.o. [phone] at [redacted]w[redacted]d being seen today for ongoing prenatal care.  She is currently monitored for the following issues for this low risk pregnancy and has Supervision of other normal pregnancy, antepartum on their problem list.  Patient reports no complaints.   Contractions: Not present. Vag. Bleeding: None.  Movement: Present. Denies leaking of fluid.   The following portions of the patient's history were reviewed and updated as appropriate: allergies, current medications, past family history, past medical history, past social history, past surgical history and problem list. Problem list updated.  Objective:   Vitals:   01/26/20 1502  BP: 106/68  Pulse: 66  Weight: 133 lb (60.3 kg)    Fetal Status: Fetal Heart Rate (bpm): 150   Movement: Present     General:  Alert, oriented and cooperative. Patient is in no acute distress.  Skin: Skin is warm and dry. No rash noted.   Cardiovascular: Normal heart rate noted  Respiratory: Normal respiratory effort, no problems with respiration noted  Abdomen: Soft, gravid, appropriate for gestational age. Pain/Pressure: Absent     Pelvic:  Cervical exam deferred        Extremities: Normal range of motion.     Mental Status: Normal mood and affect. Normal behavior. Normal judgment and thought content.   Urinalysis:      Assessment and Plan:  Pregnancy: [id number] at [redacted]w[redacted]d  1. [redacted] weeks gestation of pregnancy  2. Supervision of other normal pregnancy, antepartum Declines afp. Anatomy u/s next week  Preterm labor symptoms and general obstetric precautions including but not limited to vaginal bleeding, contractions, leaking of fluid and fetal movement were reviewed in detail with the patient. Please refer to After Visit Summary for other counseling recommendations.  Return in about 1 month (around 02/26/2020) for low risk,  in person.   Donald Bing, MD

## 2020-02-02 ENCOUNTER — Other Ambulatory Visit: Payer: Self-pay | Admitting: *Deleted

## 2020-02-02 ENCOUNTER — Other Ambulatory Visit: Payer: Self-pay

## 2020-02-02 ENCOUNTER — Ambulatory Visit: Payer: Managed Care, Other (non HMO) | Attending: Obstetrics & Gynecology

## 2020-02-02 DIAGNOSIS — Z3A19 19 weeks gestation of pregnancy: Secondary | ICD-10-CM

## 2020-02-02 DIAGNOSIS — Z3480 Encounter for supervision of other normal pregnancy, unspecified trimester: Secondary | ICD-10-CM | POA: Insufficient documentation

## 2020-02-02 DIAGNOSIS — Z362 Encounter for other antenatal screening follow-up: Secondary | ICD-10-CM

## 2020-02-02 DIAGNOSIS — Z363 Encounter for antenatal screening for malformations: Secondary | ICD-10-CM

## 2020-02-03 ENCOUNTER — Encounter: Payer: Self-pay | Admitting: Obstetrics & Gynecology

## 2020-02-03 DIAGNOSIS — O4442 Low lying placenta NOS or without hemorrhage, second trimester: Secondary | ICD-10-CM | POA: Insufficient documentation

## 2020-02-04 ENCOUNTER — Telehealth: Payer: Self-pay | Admitting: *Deleted

## 2020-02-04 NOTE — Telephone Encounter (Signed)
Called pt and went over ultrasound rests regarding her low lying placenta. 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 PT verbalizes and understands

## 2020-02-04 NOTE — Telephone Encounter (Signed)
-----   Message from Tereso Newcomer, MD sent at 02/03/2020  9:24 AM EDT ----- Please reassure patient that her low lying placenta/marginal previa is 1.6 cm away from os. It is most likely to resolve/move away in next few weeks.  The precautions I previously sent are generally given to all patients to reduce risk of bleeding.  But reassure her that by next scan, her placenta will likely be normal, that is, >2 cm away from os.  But we are just trying to be very careful for now.

## 2020-02-23 ENCOUNTER — Other Ambulatory Visit: Payer: Self-pay

## 2020-02-23 ENCOUNTER — Ambulatory Visit (INDEPENDENT_AMBULATORY_CARE_PROVIDER_SITE_OTHER): Payer: Managed Care, Other (non HMO) | Admitting: Obstetrics & Gynecology

## 2020-02-23 VITALS — BP 112/74 | HR 66 | Wt 134.0 lb

## 2020-02-23 DIAGNOSIS — O4442 Low lying placenta NOS or without hemorrhage, second trimester: Secondary | ICD-10-CM

## 2020-02-23 DIAGNOSIS — Z3480 Encounter for supervision of other normal pregnancy, unspecified trimester: Secondary | ICD-10-CM

## 2020-02-23 DIAGNOSIS — Z3A22 22 weeks gestation of pregnancy: Secondary | ICD-10-CM

## 2020-02-23 NOTE — Progress Notes (Signed)
   PRENATAL VISIT NOTE  Subjective:  Crystal Graves is a 34 y.o. [phone] at [redacted]w[redacted]d being seen today for ongoing prenatal care.  Patient is Spanish-speaking only, interpreter present for this encounter. She is currently monitored for the following issues for this low-risk pregnancy and has Supervision of other normal pregnancy, antepartum and Low-lying posterior placenta in second trimester on their problem list.  Patient reports no complaints.  Contractions: Not present. Vag. Bleeding: None.  Movement: Present. Denies leaking of fluid.   The following portions of the patient's history were reviewed and updated as appropriate: allergies, current medications, past family history, past medical history, past social history, past surgical history and problem list.   Objective:   Vitals:   02/23/20 1535  BP: 112/74  Pulse: 66  Weight: 134 lb (60.8 kg)    Fetal Status: Fetal Heart Rate (bpm): 152   Movement: Present     General:  Alert, oriented and cooperative. Patient is in no acute distress.  Skin: Skin is warm and dry. No rash noted.   Cardiovascular: Normal heart rate noted  Respiratory: Normal respiratory effort, no problems with respiration noted  Abdomen: Soft, gravid, appropriate for gestational age.  Pain/Pressure: Present     Pelvic: Cervical exam deferred        Extremities: Normal range of motion.  Edema: None  Mental Status: Normal mood and affect. Normal behavior. Normal judgment and thought content.   Assessment and Plan:  Pregnancy: [id number] at [redacted]w[redacted]d 1. Low-lying posterior placenta in second trimester 1.6 cm from os during anatomy scan, rescan scheduled. Bleeding precautions reviewed.   2. [redacted] weeks gestation of pregnancy 3. Supervision of other normal pregnancy, antepartum Declines all genetic screening .Preterm labor symptoms and general obstetric precautions including but not limited to vaginal bleeding, contractions, leaking of fluid and fetal movement were  reviewed in detail with the patient. Please refer to After Visit Summary for other counseling recommendations.   Return in about 4 weeks (around 03/22/2020) for OFFICE OB Visit (Spanish speaking only).  Future Appointments  Date Time Provider Department Center  03/05/2020  1:30 PM [facility] NURSE [facility]  03/05/2020  1:45 PM [facility]    [name], MD

## 2020-02-23 NOTE — Patient Instructions (Signed)
Segundo trimestre de embarazo Second Trimester of Pregnancy El segundo trimestre va desde la semana14 hasta la 27, desde el cuarto hasta el sexto mes, y suele ser el momento en el que mejor se siente. Su organismo se ha adaptado a estar embarazada, y comienza a sentirse fsicamente mejor. En general, las nuseas matutinas han disminuido o han desaparecido completamente, puede tener ms energa y un aumento de apetito. El segundo trimestre es tambin la poca en la que el feto se desarrolla rpidamente. Hacia el final del sexto mes, el feto mide aproximadamente 9pulgadas (23cm) y pesa alrededor de 1 libras (700g). Es probable que sienta que el beb se mueve (da pataditas) entre las 16 y 20semanas del embarazo. Cambios en el cuerpo durante el segundo trimestre Su cuerpo continua experimentando numerosos cambios durante su segundo trimestre. Estos cambios varan de una mujer a otra.  Seguir aumentando de peso. Notar que la parte baja del abdomen sobresale.  Podrn aparecer las primeras estras en las caderas, el abdomen y las mamas.  Es posible que tenga dolores de cabeza que pueden aliviarse con ciertos medicamentos. Los medicamentos que tome deben estar aprobados por el mdico.  Tal vez tenga necesidad de orinar con ms frecuencia porque el feto est ejerciendo presin sobre la vejiga.  Debido al embarazo podr sentir acidez estomacal con frecuencia.  Puede estar estreida, ya que ciertas hormonas enlentecen los movimientos de los msculos que empujan los desechos a travs de los intestinos.  Pueden aparecer hemorroides o abultarse e hincharse las venas (venas varicosas).  Puede sentir dolor en la espalda. Esto se debe a: ? Aumento de peso. ? Las hormonas del embarazo relajan las articulaciones en la pelvis. ? Un cambio en el peso y los msculos que ayudan a mantener su equilibrio.  Sus pechos seguirn creciendo y se pondrn cada vez ms sensibles.  Las encas pueden sangrar y estar  sensibles al cepillado y al hilo dental.  Pueden aparecer zonas oscuras o manchas (cloasma, mscara del embarazo) en el rostro. Esto probablemente se atenuar despus del nacimiento del beb.  Es posible que se forme una lnea oscura desde el ombligo hasta la zona del pubis (linea nigra). Esto probablemente se atenuar despus del nacimiento del beb.  Tal vez haya cambios en el cabello. Esto cambios pueden incluir su engrosamiento, crecimiento rpido y cambios en la textura. Adems, a algunas mujeres se les cae el cabello durante o despus del embarazo, o tienen el cabello seco o fino. Lo ms probable es que el cabello se le normalice despus del nacimiento del beb. Qu debe esperar en las visitas prenatales Durante una visita prenatal de rutina:  La pesarn para asegurarse de que usted y el feto estn creciendo normalmente.  Le tomarn la presin arterial.  Le medirn el abdomen para controlar el desarrollo del beb.  Se escucharn los latidos cardacos fetales.  Se evaluarn los resultados de los estudios solicitados en visitas anteriores. El mdico puede preguntarle lo siguiente:  Cmo se siente.  Si siente los movimientos del beb.  Si ha tenido sntomas anormales, como prdida de lquido, sangrado, dolores de cabeza intensos o clicos abdominales.  Si est consumiendo algn producto que contenga tabaco, como cigarrillos, tabaco de mascar y cigarrillos electrnicos.  Si tiene alguna pregunta. Otros estudios que podrn realizarse durante el segundo trimestre incluyen lo siguiente:  Anlisis de sangre para detectar lo siguiente: ? Concentraciones de hierro bajas (anemia). ? Nivel alto de azcar en la sangre que afecta a las mujeres embarazadas (diabetes   gestacional) entre las semanas 24 y 28. ? Anticuerpos Rh. Esto es para detectar una protena en los glbulos rojos (factor Rh).  Anlisis de orina para detectar infecciones, diabetes o protenas en la orina.  Una ecografa  para confirmar que el beb crece y se desarrolla correctamente.  Una amniocentesis para diagnosticar posibles problemas genticos.  Estudios del feto para descartar espina bfida y sndrome de Down.  Prueba del VIH (virus de inmunodeficiencia humana). Los exmenes prenatales de rutina incluyen la prueba de deteccin del VIH, a menos que decida no realizrsela. Siga estas indicaciones en su casa: Medicamentos  Siga las indicaciones del mdico en relacin con el uso de medicamentos. Durante el embarazo, hay medicamentos que pueden tomarse y otros que no.  Tome vitaminas prenatales que contengan por lo menos 600microgramos (?g) de cido flico.  Si est estreida, tome un laxante suave, si el mdico lo autoriza. Qu debe comer y beber   Lleve una dieta equilibrada que incluya gran cantidad de frutas y verduras frescas, cereales integrales, buenas fuentes de protenas como carnes magras, huevos o tofu, y lcteos descremados. El mdico la ayudar a determinar la cantidad de peso que puede aumentar.  No coma carne cruda ni quesos sin cocinar. Estos elementos contienen grmenes que pueden causar defectos congnitos en el beb.  Si no consume muchos alimentos con calcio, hable con su mdico sobre si debera tomar un suplemento diario de calcio.  Limite el consumo de alimentos con alto contenido de grasas y azcares procesados, como alimentos fritos o dulces.  Para evitar el estreimiento: ? Bebe suficiente lquido para mantener la orina clara o de color amarillo plido. ? Consuma alimentos ricos en fibra, como frutas y verduras frescas, cereales integrales y frijoles. Actividad  Haga ejercicio solamente como se lo haya indicado el mdico. La mayora de las mujeres pueden continuar su rutina de ejercicios durante el embarazo. Intente realizar como mnimo 30minutos de actividad fsica por lo menos 5das a la semana. Deje de hacer ejercicio si experimenta contracciones uterinas.  No levante  objetos pesados, use zapatos de tacones bajos y mantenga una buena postura.  Puede seguir manteniendo relaciones sexuales, a menos que el mdico le indique lo contrario. Alivio del dolor y del malestar  Use un sostn que le brinde buen soporte para prevenir las molestias causadas por la sensibilidad en los pechos.  Dese baos de asiento con agua tibia para aliviar el dolor o las molestias causadas por las hemorroides. Use una crema para las hemorroides si el mdico la autoriza.  Descanse con las piernas elevadas si tiene calambres o dolor de cintura.  Si tiene venas varicosas, use medias de descanso. Eleve los pies durante 15minutos, 3 o 4veces por da. Limite el consumo de sal en su dieta. Cuidados prenatales  Escriba sus preguntas. Llvelas cuando concurra a las visitas prenatales.  Concurra a todas las visitas prenatales tal como se lo haya indicado el mdico. Esto es importante. Seguridad  Use el cinturn de seguridad en todo momento mientras conduce.  Haga una lista de los nmeros de telfono de emergencia, que incluya los nmeros de telfono de familiares, amigos, el hospital y los departamentos de polica y bomberos. Instrucciones generales  Pdale al mdico que la derive a clases de educacin prenatal en su localidad. Debe comenzar a tomar las clases antes de que empiece el mes6 de embarazo.  Pida ayuda si tiene necesidades nutricionales o de asesoramiento durante el embarazo. El mdico puede aconsejarla o derivarla a especialistas para que la   ayuden con diferentes necesidades.  No se d baos de inmersin en agua caliente, baos turcos ni saunas.  No se haga duchas vaginales ni use tampones o toallas higinicas perfumadas.  No mantenga las piernas cruzadas durante mucho tiempo.  Evite el contacto con las bandejas sanitarias de los gatos y la tierra que estos animales usan. Estos elementos contienen bacterias que pueden causar defectos congnitos al beb y la posible  prdida del feto debido a un aborto espontneo o muerte fetal.  Evite fumar, consumir hierbas, beber alcohol y tomar frmacos que no le hayan recetado. Las sustancias qumicas que estos productos contienen pueden afectar la formacin y el desarrollo del beb.  No consuma ningn producto que contenga nicotina o tabaco, como cigarrillos y cigarrillos electrnicos. Si necesita ayuda para dejar de fumar, consulte al mdico.  Visite a su dentista si an no lo ha hecho durante el embarazo. Use un cepillo de dientes blando para higienizarse los dientes y psese el hilo dental con suavidad. Comunquese con un mdico si:  Tiene mareos.  Siente clicos leves, presin en la pelvis o dolor persistente en el abdomen.  Tiene nuseas, vmitos o diarrea persistentes.  Observa una secrecin vaginal con mal olor.  Siente dolor al orinar. Solicite ayuda de inmediato si:  Tiene fiebre.  Tiene una prdida de lquido por la vagina.  Tiene sangrado o pequeas prdidas vaginales.  Siente dolor intenso o clicos en el abdomen.  Sube de peso o baja de peso rpidamente.  Tiene dificultad para respirar y siente dolor de pecho.  Sbitamente se le hinchan mucho el rostro, las manos, los tobillos, los pies o las piernas.  No ha sentido los movimientos del beb durante una hora.  Siente un dolor de cabeza intenso que no se alivia al tomar medicamentos.  Nota cambios en la visin. Resumen  El segundo trimestre va desde la semana14 hasta la 27, desde el cuarto hasta el sexto mes. Es tambin una poca en la que el feto se desarrolla rpidamente.  Su organismo atraviesa por muchos cambios durante el embarazo. Estos cambios varan de una mujer a otra.  Evite fumar, consumir hierbas, beber alcohol y tomar frmacos que no le hayan recetado. Estas sustancias qumicas afectan la formacin y el desarrollo de su beb.  No consuma ningn producto que contenga tabaco, lo que incluye cigarrillos, tabaco de mascar  y cigarrillos electrnicos. Si necesita ayuda para dejar de fumar, consulte al mdico.  Comunquese con su mdico si tiene preguntas sobre esto. Concurra a todas las visitas prenatales tal como se lo haya indicado el mdico. Esto es importante. Esta informacin no tiene como fin reemplazar el consejo del mdico. Asegrese de hacerle al mdico cualquier pregunta que tenga. Document Revised: 10/02/2016 Document Reviewed: 10/02/2016 Elsevier Patient Education  2020 [employer]  

## 2020-03-05 ENCOUNTER — Encounter: Payer: Self-pay | Admitting: *Deleted

## 2020-03-05 ENCOUNTER — Ambulatory Visit: Payer: Self-pay | Attending: Obstetrics and Gynecology

## 2020-03-05 ENCOUNTER — Ambulatory Visit: Payer: Managed Care, Other (non HMO) | Admitting: *Deleted

## 2020-03-05 ENCOUNTER — Other Ambulatory Visit: Payer: Self-pay

## 2020-03-05 VITALS — BP 104/62 | HR 68

## 2020-03-05 DIAGNOSIS — Z3A24 24 weeks gestation of pregnancy: Secondary | ICD-10-CM

## 2020-03-05 DIAGNOSIS — O321XX0 Maternal care for breech presentation, not applicable or unspecified: Secondary | ICD-10-CM

## 2020-03-05 DIAGNOSIS — Z362 Encounter for other antenatal screening follow-up: Secondary | ICD-10-CM

## 2020-03-05 DIAGNOSIS — O4440 Low lying placenta NOS or without hemorrhage, unspecified trimester: Secondary | ICD-10-CM | POA: Insufficient documentation

## 2020-03-22 ENCOUNTER — Other Ambulatory Visit: Payer: Self-pay

## 2020-03-22 ENCOUNTER — Ambulatory Visit (INDEPENDENT_AMBULATORY_CARE_PROVIDER_SITE_OTHER): Payer: Managed Care, Other (non HMO) | Admitting: Obstetrics & Gynecology

## 2020-03-22 VITALS — BP 108/74 | HR 82 | Wt 139.6 lb

## 2020-03-22 DIAGNOSIS — Z3480 Encounter for supervision of other normal pregnancy, unspecified trimester: Secondary | ICD-10-CM

## 2020-03-22 NOTE — Progress Notes (Signed)
   PRENATAL VISIT NOTE  Subjective:  Crystal Graves is a 34 y.o. [phone] at [redacted]w[redacted]d being seen today for ongoing prenatal care.  She is currently monitored for the following issues for this low-risk pregnancy and has Supervision of other normal pregnancy, antepartum on their problem list.  Patient reports no complaints.  Contractions: Not present. Vag. Bleeding: None.  Movement: Present. Denies leaking of fluid.   The following portions of the patient's history were reviewed and updated as appropriate: allergies, current medications, past family history, past medical history, past social history, past surgical history and problem list.   Objective:   Vitals:   03/22/20 1326  BP: 108/74  Pulse: 82  Weight: 139 lb 9.6 oz (63.3 kg)    Fetal Status: Fetal Heart Rate (bpm): 161 Fundal Height: 26 cm Movement: Present     General:  Alert, oriented and cooperative. Patient is in no acute distress.  Skin: Skin is warm and dry. No rash noted.   Cardiovascular: Normal heart rate noted  Respiratory: Normal respiratory effort, no problems with respiration noted  Abdomen: Soft, gravid, appropriate for gestational age.  Pain/Pressure: Absent     Pelvic: Cervical exam deferred        Extremities: Normal range of motion.  Edema: None  Mental Status: Normal mood and affect. Normal behavior. Normal judgment and thought content.   Assessment and Plan:  Pregnancy: [id number] at [redacted]w[redacted]d There are no diagnoses linked to this encounter. Preterm labor symptoms and general obstetric precautions including but not limited to vaginal bleeding, contractions, leaking of fluid and fetal movement were reviewed in detail with the patient. Please refer to After Visit Summary for other counseling recommendations.  Supervision of other normal pregnancy, antepartum  No placenta previa on F/U US Return in about 2 weeks (around 04/05/2020) for 2 hr GTT.  Future Appointments  Date Time Provider Department Center    03/22/2020  1:45 PM Adam Phenix, MD [facility]    Scheryl Darter, MD

## 2020-04-06 ENCOUNTER — Ambulatory Visit (INDEPENDENT_AMBULATORY_CARE_PROVIDER_SITE_OTHER): Payer: Self-pay | Admitting: Obstetrics and Gynecology

## 2020-04-06 ENCOUNTER — Other Ambulatory Visit: Payer: Self-pay

## 2020-04-06 VITALS — BP 107/71 | HR 73 | Wt 142.0 lb

## 2020-04-06 DIAGNOSIS — Z3480 Encounter for supervision of other normal pregnancy, unspecified trimester: Secondary | ICD-10-CM

## 2020-04-06 NOTE — Progress Notes (Signed)
Prenatal Visit Note Date: 04/06/2020 Clinic: [facility]  Subjective:  Crystal Graves is a 34 y.o. [phone] at [redacted]w[redacted]d being seen today for ongoing prenatal care.  She is currently monitored for the following issues for this low-risk pregnancy and has Supervision of other normal pregnancy, antepartum on their problem list.  Patient reports no complaints.   Contractions: Not present. Vag. Bleeding: None.  Movement: Present. Denies leaking of fluid.   The following portions of the patient's history were reviewed and updated as appropriate: allergies, current medications, past family history, past medical history, past social history, past surgical history and problem list. Problem list updated.  Objective:   Vitals:   04/06/20 0952  BP: 107/71  Pulse: 73  Weight: 142 lb (64.4 kg)    Fetal Status: Fetal Heart Rate (bpm): 155 Fundal Height: 28 cm Movement: Present     General:  Alert, oriented and cooperative. Patient is in no acute distress.  Skin: Skin is warm and dry. No rash noted.   Cardiovascular: Normal heart rate noted  Respiratory: Normal respiratory effort, no problems with respiration noted  Abdomen: Soft, gravid, appropriate for gestational age. Pain/Pressure: Absent     Pelvic:  Cervical exam deferred        Extremities: Normal range of motion.  Edema: None  Mental Status: Normal mood and affect. Normal behavior. Normal judgment and thought content.   Urinalysis:      Assessment and Plan:  Pregnancy: [id number] at [redacted]w[redacted]d  1. Supervision of other normal pregnancy, antepartum Routine care - Glucose Tolerance, 2 Hours w/1 Hour - CBC - RPR - HIV Antibody (routine testing w rflx)  Preterm labor symptoms and general obstetric precautions including but not limited to vaginal bleeding, contractions, leaking of fluid and fetal movement were reviewed in detail with the patient. Please refer to After Visit Summary for other counseling  recommendations.  Return in about 2 weeks (around 04/20/2020) for low risk, in person.   [place] Bing, MD

## 2020-04-07 LAB — CBC
Hematocrit: 35.6 % (ref 34.0–46.6)
Hemoglobin: 12.2 g/dL (ref 11.1–15.9)
MCH: 31.4 pg (ref 26.6–33.0)
MCHC: 34.3 g/dL (ref 31.5–35.7)
MCV: 92 fL (ref 79–97)
Platelets: 270 10*3/uL (ref 150–450)
RBC: 3.88 x10E6/uL (ref 3.77–5.28)
RDW: 11.8 % (ref 11.7–15.4)
WBC: 7.3 10*3/uL (ref 3.4–10.8)

## 2020-04-07 LAB — GLUCOSE TOLERANCE, 2 HOURS W/ 1HR
Glucose, 1 hour: 127 mg/dL (ref 65–179)
Glucose, 2 hour: 103 mg/dL (ref 65–152)
Glucose, Fasting: 82 mg/dL (ref 65–91)

## 2020-04-07 LAB — HIV ANTIBODY (ROUTINE TESTING W REFLEX): HIV Screen 4th Generation wRfx: NONREACTIVE

## 2020-04-07 LAB — RPR: RPR Ser Ql: NONREACTIVE

## 2020-04-20 ENCOUNTER — Other Ambulatory Visit: Payer: Self-pay

## 2020-04-20 ENCOUNTER — Ambulatory Visit (INDEPENDENT_AMBULATORY_CARE_PROVIDER_SITE_OTHER): Payer: Self-pay | Admitting: Family Medicine

## 2020-04-20 ENCOUNTER — Other Ambulatory Visit (HOSPITAL_COMMUNITY)
Admission: RE | Admit: 2020-04-20 | Discharge: 2020-04-20 | Disposition: A | Discharge status: Home / Self Care | Payer: Self-pay | Source: Ambulatory Visit | Attending: Family Medicine | Admitting: Family Medicine

## 2020-04-20 ENCOUNTER — Encounter: Payer: Self-pay | Admitting: Radiology

## 2020-04-20 VITALS — BP 113/75 | HR 94 | Wt 141.0 lb

## 2020-04-20 DIAGNOSIS — O26893 Other specified pregnancy related conditions, third trimester: Secondary | ICD-10-CM | POA: Insufficient documentation

## 2020-04-20 DIAGNOSIS — N898 Other specified noninflammatory disorders of vagina: Secondary | ICD-10-CM | POA: Insufficient documentation

## 2020-04-20 DIAGNOSIS — Z3480 Encounter for supervision of other normal pregnancy, unspecified trimester: Secondary | ICD-10-CM

## 2020-04-20 MED ORDER — TERCONAZOLE 0.8 % VA CREA: 1.0000 | TOPICAL_CREAM | Freq: Every day | VAGINAL | 0 refills | Status: DC

## 2020-04-20 NOTE — Progress Notes (Signed)
   PRENATAL VISIT NOTE  Subjective:  Crystal Graves is a 34 y.o. [phone] at [redacted]w[redacted]d being seen today for ongoing prenatal care.  She is currently monitored for the following issues for this low-risk pregnancy and has Supervision of other normal pregnancy, antepartum on their problem list.  Patient reports vaginal irritation.  Contractions: Not present. Vag. Bleeding: None.  Movement: Present. Denies leaking of fluid.   The following portions of the patient's history were reviewed and updated as appropriate: allergies, current medications, past family history, past medical history, past social history, past surgical history and problem list.   Objective:   Vitals:   04/20/20 1041  BP: 113/75  Pulse: 94  Weight: 141 lb (64 kg)    Fetal Status: Fetal Heart Rate (bpm): 135 Fundal Height: 29 cm Movement: Present     General:  Alert, oriented and cooperative. Patient is in no acute distress.  Skin: Skin is warm and dry. No rash noted.   Cardiovascular: Normal heart rate noted  Respiratory: Normal respiratory effort, no problems with respiration noted  Abdomen: Soft, gravid, appropriate for gestational age.  Pain/Pressure: Absent     Pelvic: Cervical exam deferred        Extremities: Normal range of motion.  Edema: None  Mental Status: Normal mood and affect. Normal behavior. Normal judgment and thought content.   Assessment and Plan:  Pregnancy: [id number] at [redacted]w[redacted]d 1. Supervision of other normal pregnancy, antepartum 30 wks Continue routine prenatal care.   2. Vaginal discharge during pregnancy in third trimester Wet prep-treat presumptively. - terconazole (TERAZOL 3) 0.8 % vaginal cream; Place 1 applicator vaginally at bedtime.  Dispense: 20 g; Refill: 0 - Cervicovaginal ancillary only  Preterm labor symptoms and general obstetric precautions including but not limited to vaginal bleeding, contractions, leaking of fluid and fetal movement were reviewed in detail with the  patient. Please refer to After Visit Summary for other counseling recommendations.   Return in 2 weeks (on 05/04/2020).  Future Appointments  Date Time Provider Department Center  05/04/2020 10:45 AM [place] Large, Jethro Bastos, MD [facility]  05/18/2020 10:45 AM [place] Bing, MD [facility]  06/01/2020 10:45 AM [place], Jethro Bastos, MD [facility]    Reva Bores, MD

## 2020-04-20 NOTE — Patient Instructions (Signed)
 Lactancia materna Breastfeeding  Decidir amamantar es una de las mejores elecciones que puede hacer por usted y su beb. Un cambio en las hormonas durante el embarazo hace que las mamas produzcan leche materna en las glndulas productoras de leche. Las hormonas impiden que la leche materna sea liberada antes del nacimiento del beb. Adems, impulsan el flujo de leche luego del nacimiento. Una vez que ha comenzado a amamantar, pensar en el beb, as como la succin o el llanto, pueden estimular la liberacin de leche de las glndulas productoras de leche. Los beneficios de amamantar Las investigaciones demuestran que la lactancia materna ofrece muchos beneficios de salud para bebs y madres. Adems, ofrece una forma gratuita y conveniente de alimentar al beb. Para el beb  La primera leche (calostro) ayuda a mejorar el funcionamiento del aparato digestivo del beb.  Las clulas especiales de la leche (anticuerpos) ayudan a combatir las infecciones en el beb.  Los bebs que se alimentan con leche materna tambin tienen menos probabilidades de tener asma, alergias, obesidad o diabetes de tipo 2. Adems, tienen menor riesgo de sufrir el sndrome de muerte sbita del lactante (SMSL).  Los nutrientes de la leche materna son mejores para satisfacer las necesidades del beb en comparacin con la leche maternizada.  La leche materna mejora el desarrollo cerebral del beb. Para usted  La lactancia materna favorece el desarrollo de un vnculo muy especial entre la madre y el beb.  Es conveniente. La leche materna es econmica y siempre est disponible a la temperatura correcta.  La lactancia materna ayuda a quemar caloras. Le ayuda a perder el peso ganado durante el embarazo.  Hace que el tero vuelva al tamao que tena antes del embarazo ms rpido. Adems, disminuye el sangrado (loquios) despus del parto.  La lactancia materna contribuye a reducir el riesgo de tener diabetes de tipo 2,  osteoporosis, artritis reumatoide, enfermedades cardiovasculares y cncer de mama, ovario, tero y endometrio en el futuro. Informacin bsica sobre la lactancia Comienzo de la lactancia  Encuentre un lugar cmodo para sentarse o acostarse, con un buen respaldo para el cuello y la espalda.  Coloque una almohada o una manta enrollada debajo del beb para acomodarlo a la altura de la mama (si est sentada). Las almohadas para amamantar se han diseado especialmente a fin de servir de apoyo para los brazos y el beb mientras amamanta.  Asegrese de que la barriga del beb (abdomen) est frente a la suya.  Masajee suavemente la mama. Con las yemas de los dedos, masajee los bordes exteriores de la mama hacia adentro, en direccin al pezn. Esto estimula el flujo de leche. Si la leche fluye lentamente, es posible que deba continuar con este movimiento durante la lactancia.  Sostenga la mama con 4 dedos por debajo y el pulgar por arriba del pezn (forme la letra "C" con la mano). Asegrese de que los dedos se encuentren lejos del pezn y de la boca del beb.  Empuje suavemente los labios del beb con el pezn o con el dedo.  Cuando la boca del beb se abra lo suficiente, acrquelo rpidamente a la mama e introduzca todo el pezn y la arola, tanto como sea posible, dentro de la boca del beb. La arola es la zona de color que rodea al pezn. ? Debe haber ms arola visible por arriba del labio superior del beb que por debajo del labio inferior. ? Los labios del beb deben estar abiertos y extendidos hacia afuera (evertidos) para asegurar   que el beb se prenda de forma adecuada y cmoda. ? La lengua del beb debe estar entre la enca inferior y la mama.  Asegrese de que la boca del beb est en la posicin correcta alrededor del pezn (prendido). Los labios del beb deben crear un sello sobre la mama y estar doblados hacia afuera (invertidos).  Es comn que el beb succione durante 2 a 3 minutos  para que comience el flujo de leche materna. Cmo debe prenderse Es muy importante que le ensee al beb cmo prenderse adecuadamente a la mama. Si el beb no se prende adecuadamente, puede causar dolor en los pezones, reducir la produccin de leche materna y hacer que el beb tenga un escaso aumento de peso. Adems, si el beb no se prende adecuadamente al pezn, puede tragar aire durante la alimentacin. Esto puede causarle molestias al beb. Hacer eructar al beb al cambiar de mama puede ayudarlo a liberar el aire. Sin embargo, ensearle al beb cmo prenderse a la mama adecuadamente es la mejor manera de evitar que se sienta molesto por tragar aire mientras se alimenta. Signos de que el beb se ha prendido adecuadamente al pezn  Tironea o succiona de modo silencioso, sin causarle dolor. Los labios del beb deben estar extendidos hacia afuera (evertidos).  Se escucha que traga cada 3 o 4 succiones una vez que la leche ha comenzado a fluir (despus de que se produzca el reflejo de eyeccin de la leche).  Hay movimientos musculares por arriba y por delante de sus odos al succionar. Signos de que el beb no se ha prendido adecuadamente al pezn  Hace ruidos de succin o de chasquido mientras se alimenta.  Siente dolor en los pezones. Si cree que el beb no se prendi correctamente, deslice el dedo en la comisura de la boca y colquelo entre las encas del beb para interrumpir la succin. Intente volver a comenzar a amamantar. Signos de lactancia materna exitosa Signos del beb  El beb disminuir gradualmente el nmero de succiones o dejar de succionar por completo.  El beb se quedar dormido.  El cuerpo del beb se relajar.  El beb retendr una pequea cantidad de leche en la boca.  El beb se desprender solo del pecho. Signos que presenta usted  Las mamas han aumentado la firmeza, el peso y el tamao 1 a 3 horas despus de amamantar.  Estn ms blandas inmediatamente despus  de amamantar.  Se producen un aumento del volumen de leche y un cambio en su consistencia y color hacia el quinto da de lactancia.  Los pezones no duelen, no estn agrietados ni sangran. Signos de que su beb recibe la cantidad de leche suficiente  Mojar por lo menos 1 o 2paales durante las primeras 24horas despus del nacimiento.  Mojar por lo menos 5 o 6paales cada 24horas durante la primera semana despus del nacimiento. La orina debe ser clara o de color amarillo plido a los 5das de vida.  Mojar entre 6 y 8paales cada 24horas a medida que el beb sigue creciendo y desarrollndose.  Defeca por lo menos 3 veces en 24 horas a los 5 das de vida. Las heces deben ser blandas y amarillentas.  Defeca por lo menos 3 veces en 24 horas a los 7 das de vida. Las heces deben ser grumosas y amarillentas.  No registra una prdida de peso mayor al 10% del peso al nacer durante los primeros 3 das de vida.  Aumenta de peso un promedio de 4   a 7onzas (113 a 198g) por semana despus de los 4 das de vida.  Aumenta de peso, diariamente, de manera uniforme a partir de los 5 das de vida, sin registrar prdida de peso despus de las 2semanas de vida. Despus de alimentarse, es posible que el beb regurgite una pequea cantidad de leche. Esto es normal. Frecuencia y duracin de la lactancia El amamantamiento frecuente la ayudar a producir ms leche y puede prevenir dolores en los pezones y las mamas extremadamente llenas (congestin mamaria). Alimente al beb cuando muestre signos de hambre o si siente la necesidad de reducir la congestin de las mamas. Esto se denomina "lactancia a demanda". Las seales de que el beb tiene hambre incluyen las siguientes:  Aumento del estado de alerta, actividad o inquietud.  Mueve la cabeza de un lado a otro.  Abre la boca cuando se le toca la mejilla o la comisura de la boca (reflejo de bsqueda).  Aumenta las vocalizaciones, tales como sonidos de  succin, se relame los labios, emite arrullos, suspiros o chirridos.  Mueve la mano hacia la boca y se chupa los dedos o las manos.  Est molesto o llora. Evite el uso del chupete en las primeras 4 a 6 semanas despus del nacimiento del beb. Despus de este perodo, podr usar un chupete. Las investigaciones demostraron que el uso del chupete durante el primer ao de vida del beb disminuye el riesgo de tener el sndrome de muerte sbita del lactante (SMSL). Permita que el nio se alimente en cada mama todo lo que desee. Cuando el beb se desprende o se queda dormido mientras se est alimentando de la primera mama, ofrzcale la segunda. Debido a que, con frecuencia, los recin nacidos estn somnolientos las primeras semanas de vida, es posible que deba despertar al beb para alimentarlo. Los horarios de lactancia varan de un beb a otro. Sin embargo, las siguientes reglas pueden servir como gua para ayudarla a garantizar que el beb se alimenta adecuadamente:  Se puede amamantar a los recin nacidos (bebs de 4 semanas o menos de vida) cada 1 a 3 horas.  No deben transcurrir ms de 3 horas durante el da o 5 horas durante la noche sin que se amamante a los recin nacidos.  Debe amamantar al beb un mnimo de 8 veces en un perodo de 24 horas. Extraccin de leche materna     La extraccin y el almacenamiento de la leche materna le permiten asegurarse de que el beb se alimente exclusivamente de su leche materna, aun en momentos en los que no puede amamantar. Esto tiene especial importancia si debe regresar al trabajo en el perodo en que an est amamantando o si no puede estar presente en los momentos en que el beb debe alimentarse. Su asesor en lactancia puede ayudarla a encontrar un mtodo de extraccin que funcione mejor para usted y orientarla sobre cunto tiempo es seguro almacenar leche materna. Cmo cuidar las mamas durante la lactancia Los pezones pueden secarse, agrietarse y doler  durante la lactancia. Las siguientes recomendaciones pueden ayudarla a mantener las mamas humectadas y sanas:  Evite usar jabn en los pezones.  Use un sostn de soporte diseado especialmente para la lactancia materna. Evite usar sostenes con aro o sostenes muy ajustados (sostenes deportivos).  Seque al aire sus pezones durante 3 a 4minutos despus de amamantar al beb.  Utilice solo apsitos de algodn en el sostn para absorber las prdidas de leche. La prdida de un poco de leche materna entre   las tomas es normal.  Utilice lanolina sobre los pezones luego de amamantar. La lanolina ayuda a mantener la humedad normal de la piel. La lanolina pura no es perjudicial (no es txica) para el beb. Adems, puede extraer manualmente algunas gotas de leche materna y masajear suavemente esa leche sobre los pezones para que la leche se seque al aire. Durante las primeras semanas despus del nacimiento, algunas mujeres experimentan congestin mamaria. La congestin mamaria puede hacer que sienta las mamas pesadas, calientes y sensibles al tacto. El pico de la congestin mamaria ocurre en el plazo de los 3 a 5 das despus del parto. Las siguientes recomendaciones pueden ayudarla a aliviar la congestin mamaria:  Vace por completo las mamas al amamantar o extraer leche. Puede aplicar calor hmedo en las mamas (en la ducha o con toallas hmedas para manos) antes de amamantar o extraer leche. Esto aumenta la circulacin y ayuda a que la leche fluya. Si el beb no vaca por completo las mamas cuando lo amamanta, extraiga la leche restante despus de que haya finalizado.  Aplique compresas de hielo sobre las mamas inmediatamente despus de amamantar o extraer leche, a menos que le resulte demasiado incmodo. Haga lo siguiente: ? Ponga el hielo en una bolsa plstica. ? Coloque una toalla entre la piel y la bolsa de hielo. ? Coloque el hielo durante 20minutos, 2 o 3veces por da.  Asegrese de que el beb  est prendido y se encuentre en la posicin correcta mientras lo alimenta. Si la congestin mamaria persiste luego de 48 horas o despus de seguir estas recomendaciones, comunquese con su mdico o un asesor en lactancia. Recomendaciones de salud general durante la lactancia  Consuma 3 comidas y 3 colaciones saludables todos los das. Las madres bien alimentadas que amamantan necesitan entre 450 y 500 caloras adicionales por da. Puede cumplir con este requisito al aumentar la cantidad de una dieta equilibrada que realice.  Beba suficiente agua para mantener la orina clara o de color amarillo plido.  Descanse con frecuencia, reljese y siga tomando sus vitaminas prenatales para prevenir la fatiga, el estrs y los niveles bajos de vitaminas y minerales en el cuerpo (deficiencias de nutrientes).  No consuma ningn producto que contenga nicotina o tabaco, como cigarrillos y cigarrillos electrnicos. El beb puede verse afectado por las sustancias qumicas de los cigarrillos que pasan a la leche materna y por la exposicin al humo ambiental del tabaco. Si necesita ayuda para dejar de fumar, consulte al mdico.  Evite el consumo de alcohol.  No consuma drogas ilegales o marihuana.  Antes de usar cualquier medicamento, hable con el mdico. Estos incluyen medicamentos recetados y de venta libre, como tambin vitaminas y suplementos a base de hierbas. Algunos medicamentos, que pueden ser perjudiciales para el beb, pueden pasar a travs de la leche materna.  Puede quedar embarazada durante la lactancia. Si se desea un mtodo anticonceptivo, consulte al mdico sobre cules son las opciones seguras durante la lactancia. Dnde encontrar ms informacin: Liga internacional La Leche: www.llli.org. Comunquese con un mdico si:  Siente que quiere dejar de amamantar o se siente frustrada con la lactancia.  Sus pezones estn agrietados o sangran.  Sus mamas estn irritadas, sensibles o  calientes.  Tiene los siguientes sntomas: ? Dolor en las mamas o en los pezones. ? Un rea hinchada en cualquiera de las mamas. ? Fiebre o escalofros. ? Nuseas o vmitos. ? Drenaje de otro lquido distinto de la leche materna desde los pezones.  Sus mamas no   se llenan antes de amamantar al beb para el quinto da despus del parto.  Se siente triste y deprimida.  El beb: ? Est demasiado somnoliento como para comer bien. ? Tiene problemas para dormir. ? Tiene ms de 1 semana de vida y moja menos de 6 paales en un periodo de 24 horas. ? No ha aumentado de peso a los 5 das de vida.  El beb defeca menos de 3 veces en 24 horas.  La piel del beb o las partes blancas de los ojos se vuelven amarillentas. Solicite ayuda de inmediato si:  El beb est muy cansado (letargo) y no se quiere despertar para comer.  Le sube la fiebre sin causa. Resumen  La lactancia materna ofrece muchos beneficios de salud para bebs y madres.  Intente amamantar a su beb cuando muestre signos tempranos de hambre.  Haga cosquillas o empuje suavemente los labios del beb con el dedo o el pezn para lograr que el beb abra la boca. Acerque el beb a la mama. Asegrese de que la mayor parte de la arola se encuentre dentro de la boca del beb. Ofrzcale una mama y haga eructar al beb antes de pasar a la otra.  Hable con su mdico o asesor en lactancia si tiene dudas o problemas con la lactancia. Esta informacin no tiene como fin reemplazar el consejo del mdico. Asegrese de hacerle al mdico cualquier pregunta que tenga. Document Revised: 08/16/2017 Document Reviewed: 09/11/2016 Elsevier Patient Education  2020 Elsevier Inc.  

## 2020-04-21 LAB — CERVICOVAGINAL ANCILLARY ONLY
Bacterial Vaginitis (gardnerella): NEGATIVE
Candida Glabrata: NEGATIVE
Candida Vaginitis: POSITIVE — AB
Comment: NEGATIVE
Comment: NEGATIVE
Comment: NEGATIVE

## 2020-05-04 ENCOUNTER — Encounter: Payer: Self-pay | Admitting: Obstetrics & Gynecology

## 2020-05-04 ENCOUNTER — Encounter: Payer: Self-pay | Admitting: Radiology

## 2020-05-04 ENCOUNTER — Other Ambulatory Visit: Payer: Self-pay

## 2020-05-04 ENCOUNTER — Ambulatory Visit (INDEPENDENT_AMBULATORY_CARE_PROVIDER_SITE_OTHER): Payer: Self-pay | Admitting: Obstetrics & Gynecology

## 2020-05-04 VITALS — BP 110/73 | HR 91 | Wt 142.0 lb

## 2020-05-04 DIAGNOSIS — Z3A32 32 weeks gestation of pregnancy: Secondary | ICD-10-CM

## 2020-05-04 DIAGNOSIS — Z3480 Encounter for supervision of other normal pregnancy, unspecified trimester: Secondary | ICD-10-CM

## 2020-05-04 NOTE — Patient Instructions (Signed)
Informacin sobre parto y [place] de parto prematuros (Preterm Labor and Birth Information) La duracin de un embarazo normal es de 67 a 41semanas. Se llama trabajo de parto prematuro cuando se inicia antes de las 37semanas de [place]. [place]? Existen mayores probabilidades de trabajo de parto prematuro en mujeres con las siguientes caractersticas:  Tienen ciertas infecciones durante el embarazo, como infeccin de vejiga, infeccin de transmisin sexual o infeccin en el tero (corioamnionitis).  Tienen el cuello del tero ms corto que lo normal.  Tuvieron trabajo de parto prematuro anteriormente.  Se sometieron a una ciruga en el cuello del tero.  Son menores de 17aos o mayores de 50aos de edad.  Son afroamericanas.  Estn embarazadas de [employer] o de varios bebs (gestacin mltiple).  Consumen drogas o fuman mientras estn embarazadas.  No aumentan de peso lo suficiente durante el [employer].  Se embarazan poco despus de [employer] CULES SON LOS SNTOMAS DEL [place]? Los sntomas del trabajo de parto prematuro incluyen lo siguiente:  Marketing executive similares a los que ocurren durante el perodo menstrual. Los calambres pueden presentarse con diarrea.  Dolor en el abdomen o en la parte inferior de la espalda.  Contracciones uterinas regulares que se pueden sentir como una presin en el abdomen.  Una sensacin de mayor presin en la pelvis.  Aumento de la secrecin de moco acuoso o sanguinolento en la vagina.  Rotura de bolsa (rotura de saco amnitico). POR QU ES IMPORTANTE RECONOCER LOS SIGNOS DEL [place]? Es Glass blower/designer los signos del trabajo de parto prematuro porque los bebs que nacen de forma prematura pueden no estar completamente desarrollados. Por lo tanto, pueden correr mayor riesgo de lo siguiente:  Problemas cardacos y pulmonares a  Barrister's clerk (crnicos).  Inmediatamente despus del parto, dificultades para regular los sistemas corporales, que incluyen glucemia, temperatura corporal, frecuencia cardaca y frecuencia respiratoria.  Hemorragia cerebral.  Parlisis cerebral.  Dificultades en el aprendizaje.  Muerte. Estos riesgos son [employer] para bebs que nacen antes de las 34semanas de [place]. [place]? El tratamiento depende del tiempo de su [place], su afeccin y la salud de su beb. Puede incluir lo siguiente:  Tener un punto (sutura) en el cuello del tero para evitar que este se abra demasiado pronto (cerclaje).  Tomar medicamentos, por ejemplo: ? Medicamentos hormonales. Estos se pueden administrar de forma temprana en el embarazo para ayudar a Comptroller. ? [place] contracciones. ? Medicamentos que ayudan a [employer] del beb. Estos se pueden recetar si el riesgo de parto es [employer]. ? Medicamentos para evitar que el beb desarrolle parlisis cerebral. Si el trabajo de parto de inicia antes de las 34semanas de [place], es posible que deba hospitalizarse. QU DEBO HACER SI CREO QUE ESTOY EN TRABAJO DE [place]? Si cree que est iniciando trabajo de parto prematuro, llame al mdico de inmediato. [place] DE PARTO PREMATURO EN FUTUROS EMBARAZOS? Para aumentar las probabilidades de tener un embarazo a trmino, Dance movement psychotherapist en cuenta lo siguiente:  No consuma ningn producto que contenga tabaco, lo que incluye cigarrillos, tabaco de Higher education careers adviser y Psychologist, sport and exercise. Si necesita ayuda para dejar de fumar, consulte al mdico.  No consuma drogas ni medicamentos que no sean recetados Solicitor.  Hable con el mdico antes de tomar suplementos a base de hierbas aunque los haya estado tomando  peridicamente.  Asegrese de llegar a un peso Office manager.  Tenga cuidado con las  infecciones. Si cree que puede tener una infeccin, consulte al mdico para que la revisen.  Asegrese de informarle al mdico si ha tenido trabajo de parto prematuro antes. Esta informacin no tiene Theme park manager el consejo del mdico. Asegrese de hacerle al mdico cualquier pregunta que tenga. Document Revised: 01/27/2016 Document Reviewed: 10/13/2015 Elsevier Patient Education  2020 [employer].

## 2020-05-04 NOTE — Progress Notes (Signed)
   PRENATAL VISIT NOTE  Subjective:  Crystal Graves is a 34 y.o. [phone] at [redacted]w[redacted]d being seen today for ongoing prenatal care.  Patient is Spanish-speaking only, interpreter present for this encounter. She is currently monitored for the following issues for this low-risk pregnancy and has Supervision of other normal pregnancy, antepartum on their problem list.  Patient reports no complaints.  Contractions: Not present. Vag. Bleeding: None.  Movement: Present. Denies leaking of fluid.   The following portions of the patient's history were reviewed and updated as appropriate: allergies, current medications, past family history, past medical history, past social history, past surgical history and problem list.   Objective:   Vitals:   05/04/20 1045  BP: 110/73  Pulse: 91  Weight: 142 lb (64.4 kg)    Fetal Status: Fetal Heart Rate (bpm): 156 Fundal Height: 32 cm Movement: Present     General:  Alert, oriented and cooperative. Patient is in no acute distress.  Skin: Skin is warm and dry. No rash noted.   Cardiovascular: Normal heart rate noted  Respiratory: Normal respiratory effort, no problems with respiration noted  Abdomen: Soft, gravid, appropriate for gestational age.  Pain/Pressure: Absent     Pelvic: Cervical exam deferred        Extremities: Normal range of motion.  Edema: None  Mental Status: Normal mood and affect. Normal behavior. Normal judgment and thought content.   Assessment and Plan:  Pregnancy: [id number] at [redacted]w[redacted]d 1. [redacted] weeks gestation of pregnancy 2. Supervision of other normal pregnancy, antepartum [facility] Received COVID vaccines pre-pregnancy.. Preterm labor symptoms and general obstetric precautions including but not limited to vaginal bleeding, contractions, leaking of fluid and fetal movement were reviewed in detail with the patient. Please refer to After Visit Summary for other counseling recommendations.    Return in about 2 weeks (around 05/18/2020) for OFFICE OB VISIT (MD or APP).  Future Appointments  Date Time Provider Department Center  05/18/2020 10:45 AM [place] Bing, MD [facility]  06/01/2020 10:45 AM Math Brazie, Jethro Bastos, MD [facility]    [name], MD

## 2020-05-18 ENCOUNTER — Other Ambulatory Visit: Payer: Self-pay

## 2020-05-18 ENCOUNTER — Ambulatory Visit (INDEPENDENT_AMBULATORY_CARE_PROVIDER_SITE_OTHER): Payer: Self-pay | Admitting: Obstetrics and Gynecology

## 2020-05-18 VITALS — BP 101/69 | HR 88 | Wt 144.0 lb

## 2020-05-18 DIAGNOSIS — Z3A34 34 weeks gestation of pregnancy: Secondary | ICD-10-CM

## 2020-05-18 NOTE — Progress Notes (Signed)
Prenatal Visit Note Date: 05/18/2020 Clinic: [facility]  Subjective:  Crystal Graves is a 34 y.o. [phone] at [redacted]w[redacted]d being seen today for ongoing prenatal care.  She is currently monitored for the following issues for this low-risk pregnancy and has Supervision of other normal pregnancy, antepartum on their problem list.  Patient reports no complaints.   Contractions: Not present. Vag. Bleeding: None.  Movement: Present. Denies leaking of fluid.   The following portions of the patient's history were reviewed and updated as appropriate: allergies, current medications, past family history, past medical history, past social history, past surgical history and problem list. Problem list updated.  Objective:   Vitals:   05/18/20 1058  BP: 101/69  Pulse: 88  Weight: 144 lb (65.3 kg)    Fetal Status: Fetal Heart Rate (bpm): 150 Fundal Height: 34 cm Movement: Present     General:  Alert, oriented and cooperative. Patient is in no acute distress.  Skin: Skin is warm and dry. No rash noted.   Cardiovascular: Normal heart rate noted  Respiratory: Normal respiratory effort, no problems with respiration noted  Abdomen: Soft, gravid, appropriate for gestational age. Pain/Pressure: Absent     Pelvic:  Cervical exam deferred        Extremities: Normal range of motion.  Edema: None  Mental Status: Normal mood and affect. Normal behavior. Normal judgment and thought content.   Urinalysis:      Assessment and Plan:  Pregnancy: [id number] at [redacted]w[redacted]d  1. [redacted] weeks gestation of pregnancy Routine care. GBS nv. D/w pt more re: BC nf  Interpreter used  Preterm labor symptoms and general obstetric precautions including but not limited to vaginal bleeding, contractions, leaking of fluid and fetal movement were reviewed in detail with the patient. Please refer to After Visit Summary for other counseling recommendations.  RTC 10-14   [place] Bing, MD

## 2020-05-24 ENCOUNTER — Encounter: Payer: Self-pay | Admitting: *Deleted

## 2020-05-24 ENCOUNTER — Other Ambulatory Visit: Payer: Self-pay | Admitting: *Deleted

## 2020-05-24 DIAGNOSIS — O26893 Other specified pregnancy related conditions, third trimester: Secondary | ICD-10-CM

## 2020-05-24 MED ORDER — TERCONAZOLE 0.8 % VA CREA: 1.0000 | TOPICAL_CREAM | Freq: Every day | VAGINAL | 0 refills | Status: DC

## 2020-05-25 ENCOUNTER — Other Ambulatory Visit: Payer: Self-pay | Admitting: *Deleted

## 2020-05-25 DIAGNOSIS — O26893 Other specified pregnancy related conditions, third trimester: Secondary | ICD-10-CM

## 2020-05-25 MED ORDER — TERCONAZOLE 0.8 % VA CREA: 1.0000 | TOPICAL_CREAM | Freq: Every day | VAGINAL | 0 refills | Status: DC

## 2020-05-25 NOTE — Progress Notes (Signed)
Pt request RX to be sent to different pharmacy

## 2020-06-01 ENCOUNTER — Encounter: Payer: Self-pay | Admitting: Obstetrics & Gynecology

## 2020-06-01 ENCOUNTER — Other Ambulatory Visit (HOSPITAL_COMMUNITY)
Admission: RE | Admit: 2020-06-01 | Discharge: 2020-06-01 | Disposition: A | Discharge status: Home / Self Care | Payer: BC Managed Care – PPO | Source: Ambulatory Visit | Attending: Obstetrics & Gynecology | Admitting: Obstetrics & Gynecology

## 2020-06-01 ENCOUNTER — Other Ambulatory Visit: Payer: Self-pay

## 2020-06-01 ENCOUNTER — Ambulatory Visit (INDEPENDENT_AMBULATORY_CARE_PROVIDER_SITE_OTHER): Payer: BC Managed Care – PPO | Admitting: Obstetrics & Gynecology

## 2020-06-01 VITALS — BP 112/73 | HR 85 | Wt 146.0 lb

## 2020-06-01 DIAGNOSIS — O23593 Infection of other part of genital tract in pregnancy, third trimester: Secondary | ICD-10-CM | POA: Diagnosis not present

## 2020-06-01 DIAGNOSIS — Z3A36 36 weeks gestation of pregnancy: Secondary | ICD-10-CM

## 2020-06-01 DIAGNOSIS — Z3483 Encounter for supervision of other normal pregnancy, third trimester: Secondary | ICD-10-CM

## 2020-06-01 MED ORDER — FLUCONAZOLE 150 MG PO TABS: 150.0000 mg | ORAL_TABLET | Freq: Once | ORAL | 0 refills | Status: AC

## 2020-06-01 NOTE — Progress Notes (Signed)
   PRENATAL VISIT NOTE  Subjective:  Crystal Graves is a 34 y.o. [phone] at [redacted]w[redacted]d being seen today for ongoing prenatal care. Patient is Spanish-speaking only, interpreter present for this encounter.  She is currently monitored for the following issues for this low-risk pregnancy and has Supervision of other normal pregnancy, antepartum on their problem list.  Patient reports vaginal irritation and vulvar swelling. Had yeast infections recently, has been treated a few times with Terazol. Symptoms only alleviated for a short time.  Contractions: Regular. Vag. Bleeding: None.  Movement: Present. Denies leaking of fluid.   The following portions of the patient's history were reviewed and updated as appropriate: allergies, current medications, past family history, past medical history, past social history, past surgical history and problem list.   Objective:   Vitals:   06/01/20 1103  BP: 112/73  Pulse: 85  Weight: 146 lb (66.2 kg)    Fetal Status: Fetal Heart Rate (bpm): 144 Fundal Height: 36 cm Movement: Present  Presentation: Vertex  General:  Alert, oriented and cooperative. Patient is in no acute distress.  Skin: Skin is warm and dry. No rash noted.   Cardiovascular: Normal heart rate noted  Respiratory: Normal respiratory effort, no problems with respiration noted  Abdomen: Soft, gravid, appropriate for gestational age.  Pain/Pressure: Absent     Pelvic: Erythematous and edematous vulva, no lesions. Mild tenderness to touch. Clumpy white disharge noted, testing sample obtained. Cervix:  Dilation: 1 Effacement (%): 80 Station: -2.  Exam performed in the presence of a chaperone  Extremities: Normal range of motion.  Edema: None  Mental Status: Normal mood and affect. Normal behavior. Normal judgment and thought content.   Assessment and Plan:  Pregnancy: [id number] at [redacted]w[redacted]d 1. Vulvovaginitis complicating pregnancy in third trimester, antepartum Concerned about recurrent yeast  vaginitis. Diflucan prescribed instead of repeat Terazol, will monitor response.  Follow up testing results.  - Cervicovaginal ancillary only( [place])  2. [redacted] weeks gestation of pregnancy 3. Encounter for supervision of other normal pregnancy in third trimester - Strep Gp B NAA done, will follow up results and manage accordingly. Labor symptoms and general obstetric precautions including but not limited to vaginal bleeding, contractions, leaking of fluid and fetal movement were reviewed in detail with the patient. Please refer to After Visit Summary for other counseling recommendations.   Return in about 1 week (around 06/08/2020) for OFFICE OB VISIT (MD or APP).  Future Appointments  Date Time Provider Department Center  06/09/2020  2:40 PM Briant Sites [facility]  06/15/2020 10:30 AM Genna Casimir, Jethro Bastos, MD [facility]    [name], MD

## 2020-06-01 NOTE — Patient Instructions (Signed)
Signos y sntomas del trabajo de parto Signs and Symptoms of Labor El trabajo de parto es el proceso natural del cuerpo por el cual se saca al beb, la placenta y el cordn umbilical del tero. Por lo general, el proceso del trabajo de parto comienza cuando el embarazo ha llegado a su trmino, entre 37 y 40 semanas de embarazo. Cmo sabr que estoy prxima a comenzar el trabajo de parto? A medida que el cuerpo se prepara para el trabajo de parto y el nacimiento del beb, puede notar los siguientes sntomas en las semanas y das anteriores al trabajo de parto propiamente dicho:  Un fuerte deseo de preparar su casa para recibir al nuevo beb. Esto se denomina anidacin. La anidacin puede ser un signo de que se est acercando el trabajo de parto, y puede ocurrir varias semanas antes del nacimiento. La anidacin puede implicar limpiar y organizar la casa.  Una pequea cantidad de mucosidad espesa y con sangre sale de la vagina (aparicin normal de sangre o prdida del tapn mucoso). Esto puede suceder ms de una semana antes de que comience el trabajo de parto, o puede ocurrir justo antes de que comience el trabajo de parto a medida que el cuello uterino comienza a ensancharse (dilatarse). En algunas mujeres, el tapn mucoso sale entero de una sola vez. En otras, pueden salir pequeas partes del tapn mucoso de forma gradual durante varios das.  El beb se mueve (desciende) a la parte inferior de la pelvis para ponerse en posicin para el nacimiento (aligeramiento). Cuando esto sucede, puede sentir ms presin en la vejiga y el hueso plvico, y menos presin en las costillas. Esto facilitar la respiracin. Tambin puede hacer que necesite orinar con ms frecuencia y que tenga problemas para hacer de vientre.  Tener "contracciones de prctica" (contracciones de Braxton Hicks) que ocurren a intervalos irregulares (espaciadas de modo desigual) con una diferencia de ms de 10 minutos. Esto tambin se  denomina trabajo de parto falso. Las contracciones de trabajo de parto falso son comunes luego del ejercicio o la actividad sexual, y se detendrn si cambia de posicin, descansa o toma lquidos. Estas contracciones son generalmente leves y no se tornan ms fuertes con el tiempo. Pueden sentirse como lo siguiente: ? Un dolor de espalda. ? Calambres leves, similares a los dolores menstruales. ? Tirantez o presin en el abdomen. Otros sntomas tempranos de que el trabajo de parto puede comenzar pronto incluyen:  Nuseas o prdida del apetito.  Diarrea.  Un repentino estallido de energa o sentirse muy cansada.  Cambios en el humor.  Problemas para dormir. Cmo sabr cuando ha comenzado el trabajo de parto? Los signos de que ha comenzado el trabajo de parto verdadero pueden incluir:  Contracciones a intervalos regulares (espaciadas de modo regular) que se incrementan en intensidad. Esto puede sentirse como presin o estrechamiento intenso en el abdomen, que se desplaza hacia la espalda. ? Las contracciones pueden sentirse tambin como dolor rtmico en la parte superior de los muslos y la espalda que va y viene a intervalos regulares. ? Para las madres primerizas, este cambio en intensidad de las contracciones ocurre generalmente a un ritmo ms gradual. ? Las mujeres que ya han sido madres pueden notar una progresin ms rpida de los cambios de las contracciones.  Una sensacin de presin en el rea vaginal.  Ruptura de la bolsa (ruptura de las membranas). Es cuando el saco de lquido que rodea al beb se rompe. Cuando esto sucede, notar que le   sale lquido de la vagina. Este puede ser claro o estar manchado de sangre. Generalmente el trabajo de parto comienza 24 horas despus de la ruptura de bolsa, pero puede tomar ms tiempo en comenzar. ? Algunas mujeres notan esto como un chorro de lquido. ? Otras notan que la ropa interior se moja repetidas veces. Siga estas indicaciones en su  casa:   Cuando comience el trabajo de parto o si rompe bolsa, llame al mdico o a la lnea de atencin de enfermera. Ellos, en funcin de su situacin, determinarn cundo debe ir a hacerse un examen.  Si entra en trabajo de parto temprano, es posible que pueda descansar y manejar los sntomas en su casa. Algunas estrategias para probar en su casa incluyen: ? Tcnicas de respiracin y relajacin. ? Tomar una ducha o un bao de inmersin tibios. ? Escuchar msica. ? Usar una almohadilla trmica en la espalda para aliviar el dolor. Si se le indica que use calor:  Coloque una toalla entre la piel y la fuente de calor.  Aplique el calor durante 20 a 30minutos.  Retire la fuente de calor si la piel se pone de color rojo brillante. Esto es muy importante si no puede sentir dolor, calor o fro. Puede correr un riesgo mayor de sufrir quemaduras. Solicite ayuda de inmediato si:  Tiene contracciones dolorosas y regulares cada 5 minutos o menos.  El trabajo de parto comienza antes de que se cumplan las 37 semanas de embarazo.  Tiene fiebre.  Siente un dolor de cabeza intenso que no se alivia.  Elimina cogulos de sangre de color rojo brillante por la vagina.  No siente que el beb se mueva.  Experimenta la aparicin repentina de: ? Dolor de cabeza intenso con problemas de la visin. ? Nuseas, vmitos o diarrea. ? Dolor en el pecho o falta de aire. Estos sntomas pueden indicar una emergencia. Si el mdico recomienda que vaya al hospital o al centro de nacimientos donde va a dar a luz, no conduzca usted misma. Pdale a otra persona que conduzca o llame a los servicios de emergencia (911 en los [place]) Resumen  El trabajo de parto es el proceso natural del cuerpo por el cual se saca al beb, la placenta y el cordn umbilical del tero.  Por lo general, el proceso del trabajo de parto comienza cuando el embarazo ha llegado a su trmino, entre 37 y 40 semanas de embarazo.  Cuando  comience el trabajo de parto o si rompe bolsa, llame al mdico o a la lnea de atencin de enfermera. Ellos, en funcin de su situacin, determinarn cundo debe ir a hacerse un examen. Esta informacin no tiene como fin reemplazar el consejo del mdico. Asegrese de hacerle al mdico cualquier pregunta que tenga. Document Revised: 02/19/2017 Document Reviewed: 02/14/2017 Elsevier Patient Education  2020 Elsevier Inc.  

## 2020-06-02 LAB — CERVICOVAGINAL ANCILLARY ONLY
Bacterial Vaginitis (gardnerella): NEGATIVE
Candida Glabrata: NEGATIVE
Candida Vaginitis: POSITIVE — AB
Chlamydia: NEGATIVE
Comment: NEGATIVE
Comment: NEGATIVE
Comment: NEGATIVE
Comment: NEGATIVE
Comment: NEGATIVE
Comment: NORMAL
Neisseria Gonorrhea: NEGATIVE
Trichomonas: NEGATIVE

## 2020-06-09 ENCOUNTER — Encounter: Payer: BC Managed Care – PPO | Admitting: Advanced Practice Midwife

## 2020-06-14 ENCOUNTER — Encounter: Payer: Self-pay | Admitting: Radiology

## 2020-06-15 ENCOUNTER — Encounter: Payer: BC Managed Care – PPO | Admitting: Obstetrics & Gynecology

## 2020-06-28 ENCOUNTER — Encounter: Payer: Self-pay | Admitting: Radiology

## 2020-07-19 ENCOUNTER — Ambulatory Visit (INDEPENDENT_AMBULATORY_CARE_PROVIDER_SITE_OTHER): Payer: BC Managed Care – PPO | Admitting: Obstetrics & Gynecology

## 2020-07-19 ENCOUNTER — Encounter: Payer: Self-pay | Admitting: *Deleted

## 2020-07-19 ENCOUNTER — Other Ambulatory Visit: Payer: Self-pay

## 2020-07-19 MED ORDER — SLYND 4 MG PO TABS: 1.0000 | ORAL_TABLET | Freq: Every day | ORAL | 11 refills | Status: DC

## 2020-07-19 NOTE — Progress Notes (Signed)
Post Partum Visit Note  Crystal Graves is a 35 y.o. [phone] female who presents for a postpartum visit. Patient is Spanish-speaking only, interpreter present for this encounter. She is 6 weeks postpartum following a normal spontaneous vaginal delivery.  I have fully reviewed the prenatal and intrapartum course. The delivery was at 38w gestational weeks.  Anesthesia: none. Postpartum course has been uncomplicated. Baby is doing well. Baby is feeding by breast. Bleeding no bleeding. Bowel function is normal. Bladder function is normal. Patient is not sexually active. Contraception method is none. Postpartum depression screening: negative.  The pregnancy intention screening data noted above was reviewed. Potential methods of contraception were discussed. The patient elected to proceed with Oral Contraceptive.  Edinburgh Postnatal Depression Scale - 07/19/20 1416      Edinburgh Postnatal Depression Scale:  In the Past 7 Days   I have been able to laugh and see the funny side of things. 1    I have looked forward with enjoyment to things. 0    I have blamed myself unnecessarily when things went wrong. 1    I have been anxious or worried for no good reason. 0    I have felt scared or panicky for no good reason. 1    Things have been getting on top of me. 1    I have been so unhappy that I have had difficulty sleeping. 1    I have felt sad or miserable. 0    I have been so unhappy that I have been crying. 1    The thought of harming myself has occurred to me. 0    Edinburgh Postnatal Depression Scale Total 6           The following portions of the patient's history were reviewed and updated as appropriate: allergies, current medications, past family history, past medical history, past social history, past surgical history and problem list.  Review of Systems Pertinent items noted in HPI and remainder of comprehensive ROS otherwise negative.    Objective:  BP 104/69   Pulse 61    Wt 130 lb 12.8 oz (59.3 kg)   BMI 21.11 kg/m    General:  alert and no distress   Breasts:  inspection negative, no nipple discharge or bleeding, no masses or nodularity palpable  Lungs: clear to auscultation bilaterally  Heart:  regular rate and rhythm  Abdomen: soft, non-tender; bowel sounds normal; no masses,  no organomegaly   Pelvic:  not evaluated        Assessment:   Normal postpartum exam.   Plan:   Essential components of care per ACOG recommendations:  1.  Mood and well being: Patient with negative depression screening today. Reviewed local resources for support.  - Patient does not use tobacco or drug use.  2. Infant care and feeding:  -Patient currently breastmilk feeding? Yes . Reviewed importance of draining breast regularly to support lactation. -Social determinants of health (SDOH) reviewed in EPIC. No concerns.  3. Sexuality, contraception and birth spacing - Patient does not want a pregnancy in the next year.  Desired family size is 4 children at most.  - Reviewed forms of contraception in tiered fashion. Patient desired oral progesterone-only contraceptive today.  Slynd samples given and prescribed.  - Discussed birth spacing of 18 months  4. Sleep and fatigue -Encouraged family/partner/community support of 4 hrs of uninterrupted sleep to help with mood and fatigue  5. Physical Recovery  - Discussed patients delivery - Patient  has urinary incontinence? No - Patient is safe to resume physical and sexual activity  6.  Health Maintenance - Last pap smear done 01/15/2019 and was normal with negative HPV.   [name], MD Center for [employer], [facility]

## 2020-09-20 ENCOUNTER — Encounter: Payer: Self-pay | Admitting: Obstetrics and Gynecology

## 2020-09-20 ENCOUNTER — Ambulatory Visit (INDEPENDENT_AMBULATORY_CARE_PROVIDER_SITE_OTHER): Payer: BC Managed Care – PPO | Admitting: Obstetrics and Gynecology

## 2020-09-20 ENCOUNTER — Ambulatory Visit: Payer: BC Managed Care – PPO | Admitting: Obstetrics and Gynecology

## 2020-09-20 ENCOUNTER — Other Ambulatory Visit: Payer: Self-pay

## 2020-09-20 VITALS — BP 113/73 | HR 82 | Wt 127.0 lb

## 2020-09-20 DIAGNOSIS — Z30017 Encounter for initial prescription of implantable subdermal contraceptive: Secondary | ICD-10-CM

## 2020-09-20 MED ORDER — ETONOGESTREL 68 MG ~~LOC~~ IMPL
68.0000 mg | DRUG_IMPLANT | Freq: Once | SUBCUTANEOUS | Status: AC
  Administered 2020-09-20: 68 mg via SUBCUTANEOUS

## 2020-09-20 NOTE — Progress Notes (Signed)
Discuss changing birth control and is still breast feeding

## 2020-09-20 NOTE — Procedures (Addendum)
Nexplanon Insertion Procedure Note Patient on Slynd and would like to do Nexplanon again. She's used it in the past and had no problems or issues with it. She's also had no problems or issues with OCPs but she would like to get back on Nexplanon.   Prior to the procedure being performed, the patient (or guardian) was asked to state their full name, date of birth, type of procedure being performed and the exact location of the operative site. This information was then checked against the documentation in the patient's chart. Prior to the procedure being performed, a "time out" was performed by the physician that confirmed the correct patient, procedure and site.  After informed consent was obtained, the patient's non-dominant right arm, at the old incision site, was chosen for insertion. A site was marked approximately 8 cm proximal to the medial epicondyle in the sulcus between the biceps and triceps on the inner surface. The area was cleaned with alcohol then local anesthesia was infiltrated with 3 ml of 1% lidocaine along the planned insertion track. The area was prepped with betadine. Using sterile technique the Nexplanon device was inserted per manufacturer's guidelines in the subdermal connective tissue using the standard insertion technique without difficulty. Pressure was applied and the insertion site was hemostatic. The presence of the Nexplanon was confirmed immediately after insertion by palpation by both me and the patient and by checking the tip of needle for the absence of the insert.  A pressure dressing was applied.  The patient tolerated the procedure well.  Patient was advised to overlap the pills by a week and then can come off the OCPs  Interpreter used  Cornelia Copa MD Attending Center for [employer] [facility])

## 2020-09-21 ENCOUNTER — Encounter: Payer: Self-pay | Admitting: Obstetrics and Gynecology

## 2020-09-21 HISTORY — PX: INSERTION OF IMPLANON ROD: OBO 1005

## 2020-09-21 MED ORDER — ETONOGESTREL 68 MG ~~LOC~~ IMPL: 68.0000 mg | DRUG_IMPLANT | Freq: Once | SUBCUTANEOUS | Status: DC

## 2020-10-12 IMAGING — US US OB TRANSVAGINAL
1 series · 15 of 28 positions shown · non-contrast
Comparison: 01/15/2019 obstetric scan.

CLINICAL DATA: 32-year-old female with reported spontaneous
abortion with persistent vaginal bleeding.

EXAM:
TRANSVAGINAL OB ULTRASOUND
TECHNIQUE: Transvaginal ultrasound was performed for complete evaluation of the
gestation as well as the maternal uterus, adnexal regions, and
pelvic cul-de-sac.

[Series 1: us ob transvaginal · 15 of 41 slices shown]
[im 1/41]
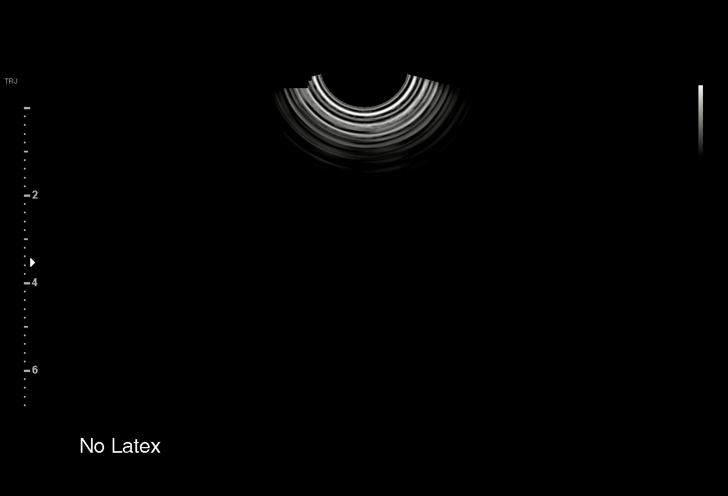
[im 3/41]
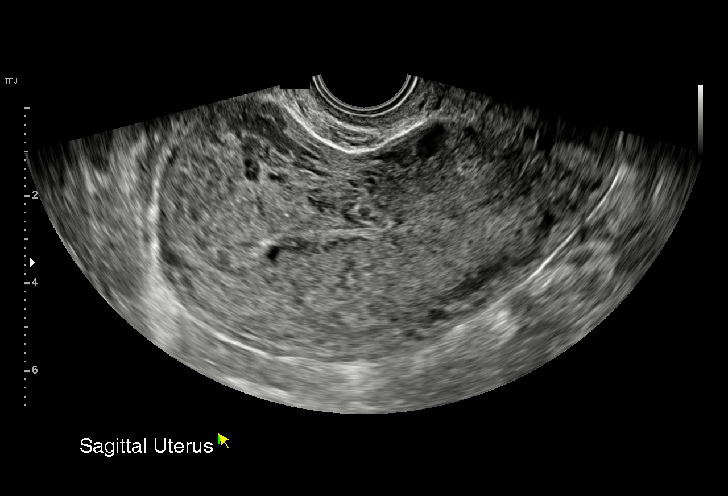
[im 6/41]
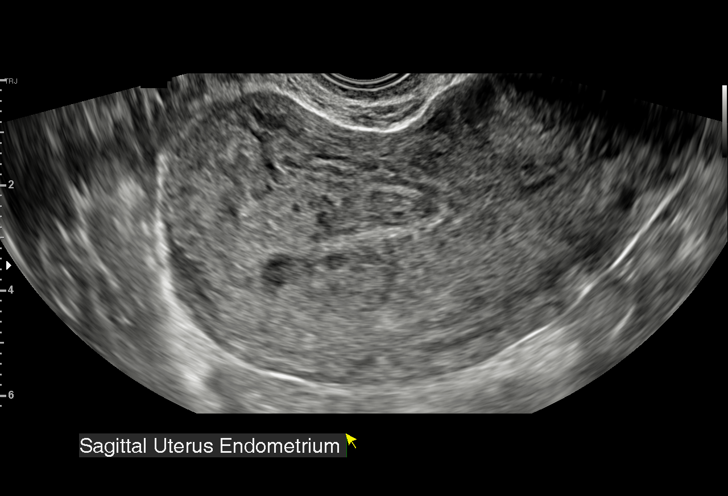
[im 9/41]
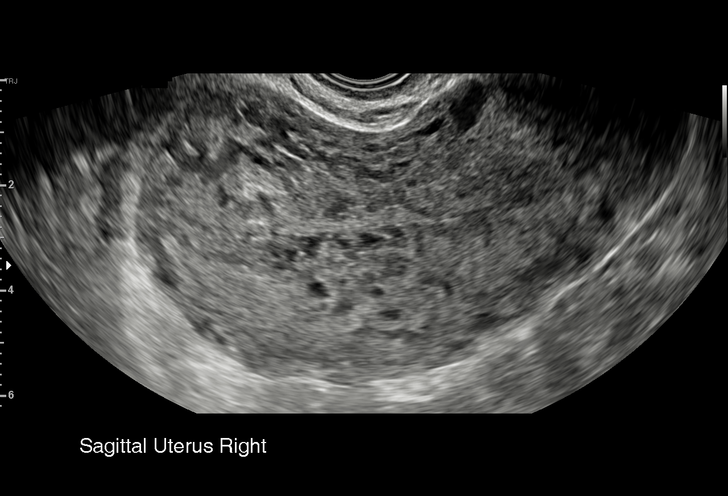
[im 12/41]
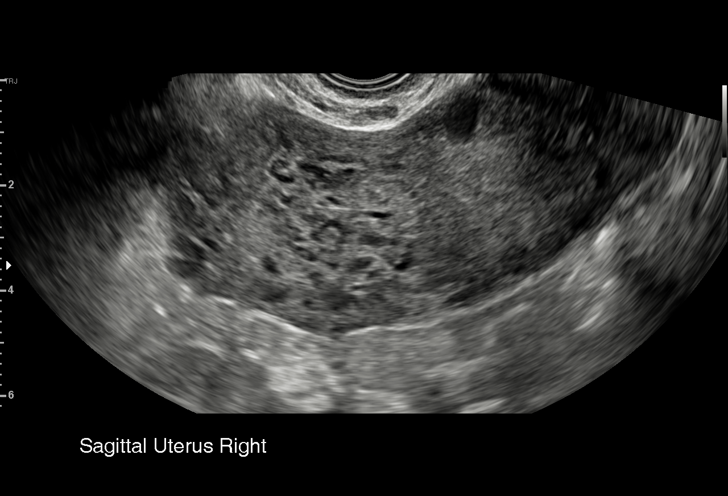
[im 15/41]
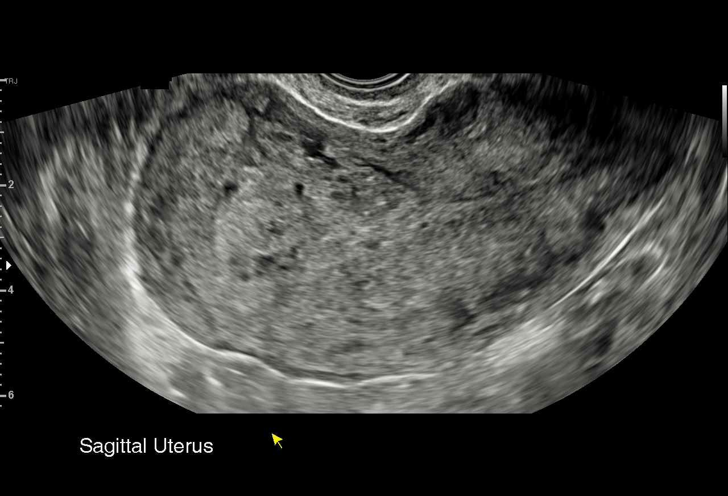
[im 18/41]
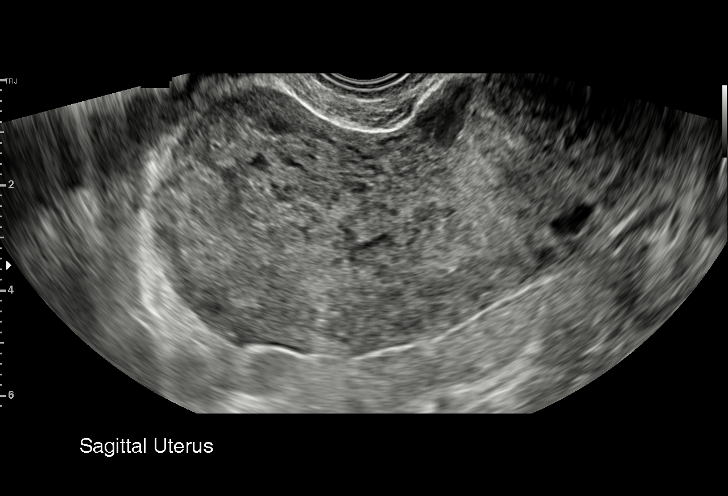
[im 21/41]
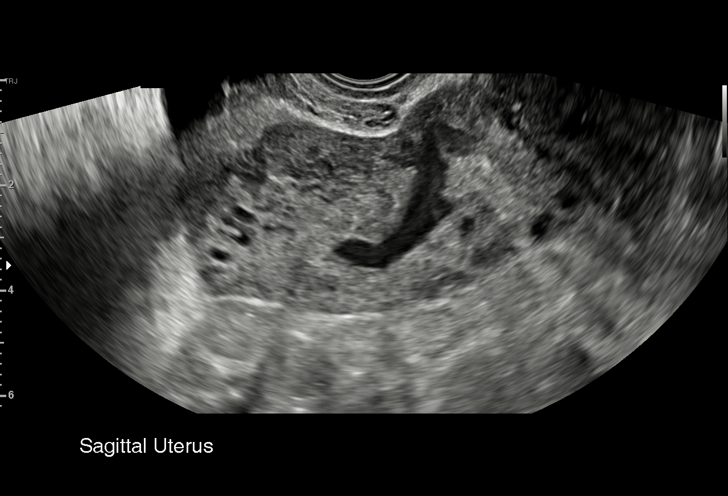
[im 23/41]
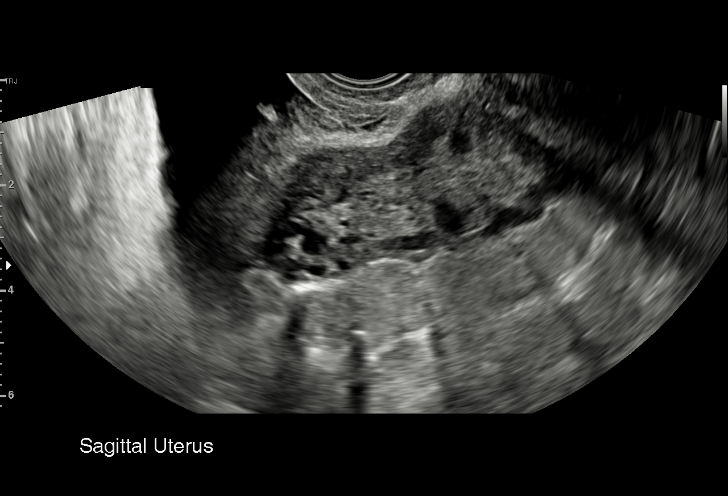
[im 26/41]
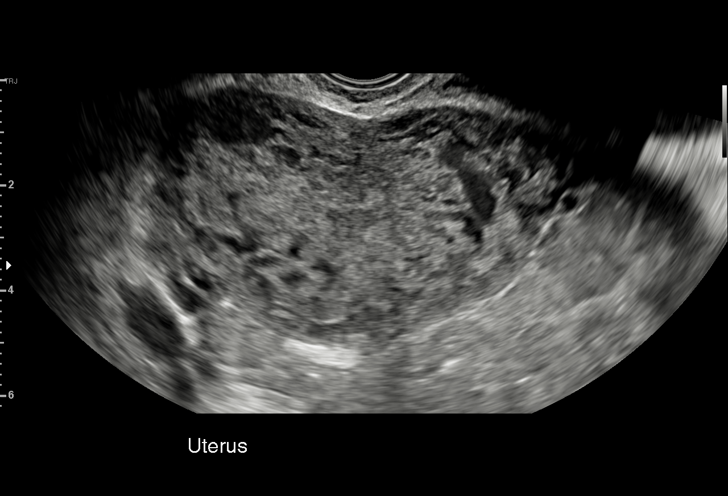
[im 29/41]
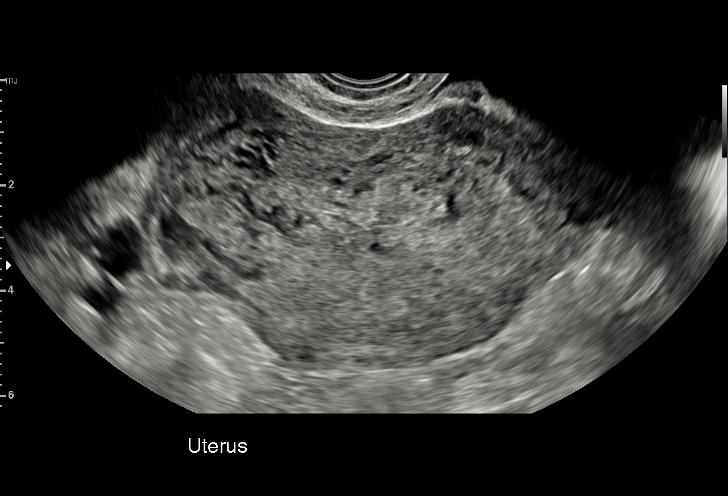
[im 32/41]
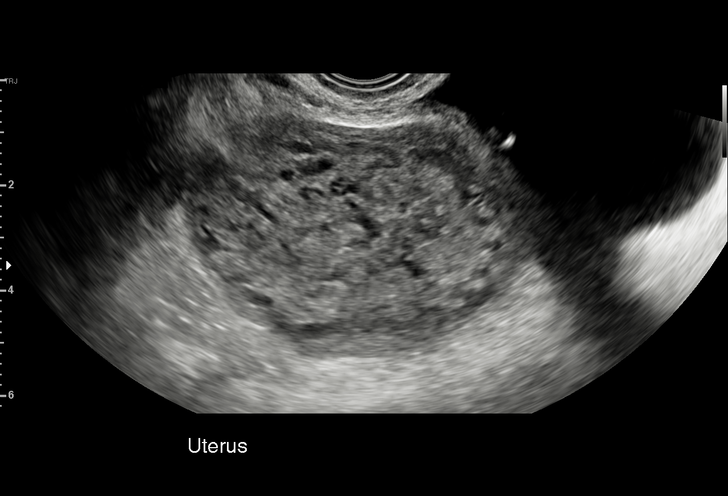
[im 35/41]
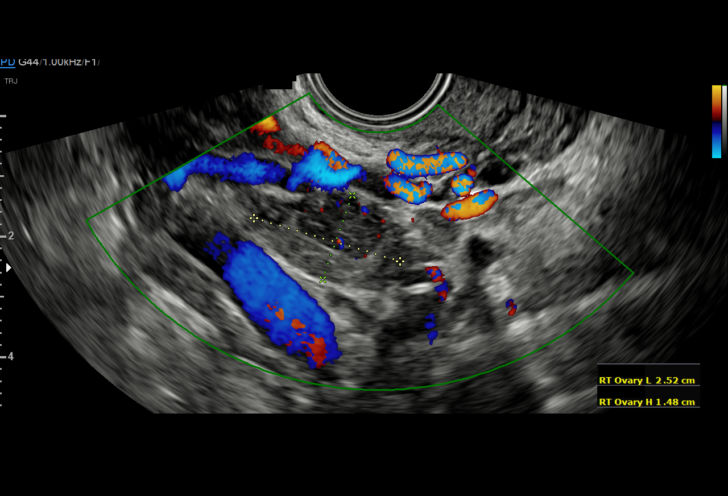
[im 38/41]
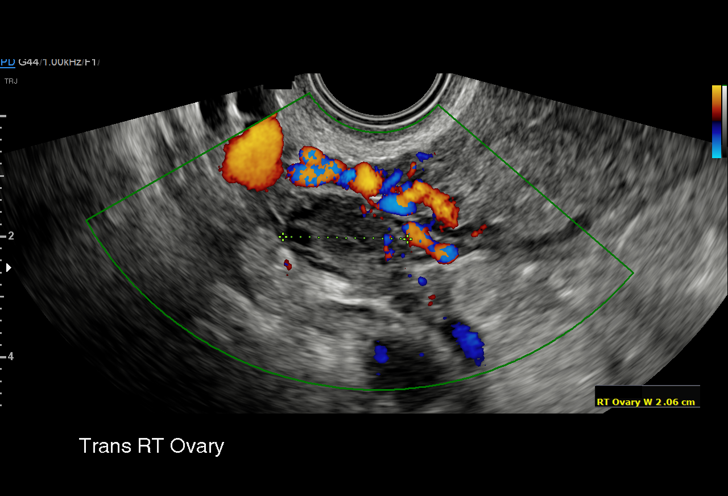
[im 41/41]
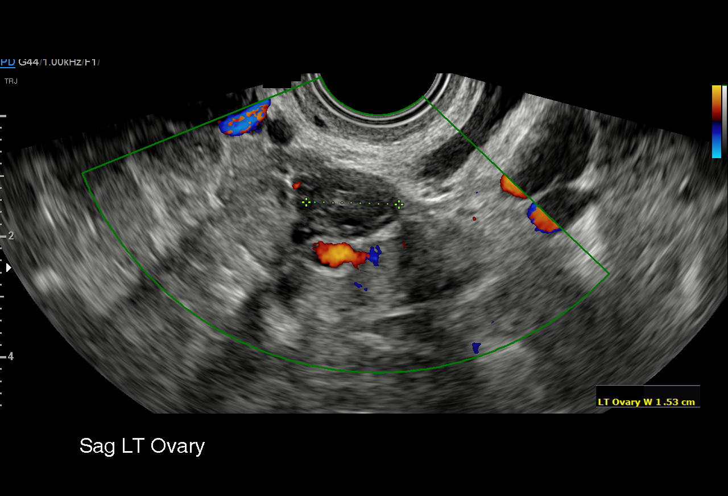

[15 of 28 positions shown; findings below may reference images not displayed]

FINDINGS: Anteverted uterus is normal in size and configuration, with no
uterine fibroids or other myometrial abnormalities. Thin endometrium
(3 mm bilayer thickness). No endometrial cavity fluid or focal
endometrial mass. No endometrial hypervascularity. No evidence of
retained products of conception.

Right ovary measures 2.5 x 1.5 x 2.1 cm. Left ovary measures 2.3 x
1.2 x 1.5 cm. No ovarian or adnexal masses. No abnormal free fluid
in the pelvis.
IMPRESSION: Interval spontaneous abortion, with no evidence of retained products
of conception. Thin (3 mm) endometrium. No ovarian or adnexal
masses.

## 2021-04-12 ENCOUNTER — Other Ambulatory Visit: Payer: Self-pay

## 2021-04-12 ENCOUNTER — Ambulatory Visit: Payer: BC Managed Care – PPO | Admitting: Obstetrics & Gynecology

## 2021-04-12 ENCOUNTER — Encounter: Payer: Self-pay | Admitting: Obstetrics & Gynecology

## 2021-04-12 VITALS — BP 120/75 | HR 66 | Wt 133.0 lb

## 2021-04-12 DIAGNOSIS — N921 Excessive and frequent menstruation with irregular cycle: Secondary | ICD-10-CM | POA: Diagnosis not present

## 2021-04-12 DIAGNOSIS — Z975 Presence of (intrauterine) contraceptive device: Secondary | ICD-10-CM

## 2021-04-12 DIAGNOSIS — Z3046 Encounter for surveillance of implantable subdermal contraceptive: Secondary | ICD-10-CM | POA: Diagnosis not present

## 2021-04-12 MED ORDER — NORGESTIMATE-ETH ESTRADIOL 0.25-35 MG-MCG PO TABS: 1.0000 | ORAL_TABLET | Freq: Every day | ORAL | 2 refills | Status: DC

## 2021-04-12 NOTE — Progress Notes (Signed)
GYNECOLOGY OFFICE VISIT NOTE  History:   Crystal Graves is a 35 y.o. [phone] here today for report of breakthrough bleeding for 4 months, with Nexplanon in place. Patient is Spanish-speaking only, interpreter present for this encounter. Nexplanon was inserted 09/20/20.  She is tired of this bleeding, did not have this last time she had Nexplanon in place prior to last pregnancy.  Considering removing the Nexplanon.  She denies any abnormal vaginal discharge  pelvic pain or other concerns.    Past Medical History:  Diagnosis Date   ASCUS with positive high risk HPV cervical    Chlamydia infection 06/29/2017   Moderate dysplasia of cervix (CIN II) 11/11/2014   CIN II noted on colposcopy done after LGSIL pap on 11/10/14 Cryotherapy done on 12/10/14 06/22/15 Normal cytology on pap smear with positive HRHPV, but negative HPV 16 and 18/45 -> repeat cotesting in one year 06/27/16  ASCUS +HPV  Colposcopy showed CIN II, LEEP recommeded LEEP on 09/05/2016 showed CIN I.     Past Surgical History:  Procedure Laterality Date   COLPOSCOPY W/ BIOPSY / CURETTAGE  11/10/2014   Done for LGSIL pap smear   GYNECOLOGIC CRYOSURGERY  12/10/2014   Done for CIN II (colposcopy pathology after LGSIL pap)   INSERTION OF IMPLANON ROD  09/21/2020        The following portions of the patient's history were reviewed and updated as appropriate: allergies, current medications, past family history, past medical history, past social history, past surgical history and problem list.   Health Maintenance:  Normal pap and negative HRHPV on 01/15/2019.  Review of Systems:  Pertinent items noted in HPI and remainder of comprehensive ROS otherwise negative.  Physical Exam:  BP 120/75   Pulse 66   Wt 133 lb (60.3 kg)   BMI 21.47 kg/m  CONSTITUTIONAL: Well-developed, well-nourished female in no acute distress.  HEENT:  Normocephalic, atraumatic. External right and left ear normal. No scleral icterus.  NECK: Normal range  of motion, supple, no masses noted on observation SKIN: No rash noted. Not diaphoretic. No erythema. No pallor. MUSCULOSKELETAL: Normal range of motion. No edema noted. NEUROLOGIC: Alert and oriented to person, place, and time. Normal muscle tone coordination. No cranial nerve deficit noted. PSYCHIATRIC: Normal mood and affect. Normal behavior. Normal judgment and thought content. CARDIOVASCULAR: Normal heart rate noted RESPIRATORY: Effort and breath sounds normal, no problems with respiration noted ABDOMEN: No masses noted. No other overt distention noted.   PELVIC: Deferred     Assessment and Plan:    1. Breakthrough bleeding on Nexplanon Discussed that this can happen with Nexplanon, recommended trial of OCPs to help with this in lieu of going straight to removal of Nexplanon.  Will try this for two months and then stop pills.  If AUB recurs, can consider removal of Nexplanon and just continuing on pills. Patient is agreeable to this plan. - norgestimate-ethinyl estradiol (ORTHO-CYCLEN) 0.25-35 MG-MCG tablet; Take 1 tablet by mouth daily.  Dispense: 28 tablet; Refill: 2  Routine preventative health maintenance measures emphasized. Please refer to After Visit Summary for other counseling recommendations.   Return in about 2 months (around 06/12/2021) for Followup breakthorugh bleeding on Nexplanon.    I spent 20 minutes dedicated to the care of this patient including pre-visit review of records, face to face time with the patient discussing her conditions and treatments and post visit orders.    [name], MD, FACOG Obstetrician & Gynecologist, [facility] for [employer], [facility]

## 2021-05-05 ENCOUNTER — Encounter: Payer: Self-pay | Admitting: Radiology

## 2021-07-03 ENCOUNTER — Other Ambulatory Visit: Payer: Self-pay | Admitting: Obstetrics & Gynecology

## 2021-07-03 DIAGNOSIS — Z975 Presence of (intrauterine) contraceptive device: Secondary | ICD-10-CM

## 2021-07-03 DIAGNOSIS — N921 Excessive and frequent menstruation with irregular cycle: Secondary | ICD-10-CM

## 2021-08-04 ENCOUNTER — Ambulatory Visit (INDEPENDENT_AMBULATORY_CARE_PROVIDER_SITE_OTHER): Payer: BC Managed Care – PPO | Admitting: Obstetrics and Gynecology

## 2021-08-04 ENCOUNTER — Other Ambulatory Visit (HOSPITAL_COMMUNITY)
Admission: RE | Admit: 2021-08-04 | Discharge: 2021-08-04 | Disposition: A | Discharge status: Home / Self Care | Payer: BC Managed Care – PPO | Source: Ambulatory Visit | Attending: Obstetrics and Gynecology | Admitting: Obstetrics and Gynecology

## 2021-08-04 ENCOUNTER — Encounter: Payer: Self-pay | Admitting: Obstetrics and Gynecology

## 2021-08-04 ENCOUNTER — Other Ambulatory Visit: Payer: Self-pay | Admitting: Obstetrics and Gynecology

## 2021-08-04 ENCOUNTER — Other Ambulatory Visit: Payer: Self-pay

## 2021-08-04 ENCOUNTER — Ambulatory Visit: Payer: BC Managed Care – PPO | Admitting: Podiatry

## 2021-08-04 VITALS — BP 116/78 | HR 76 | Wt 133.0 lb

## 2021-08-04 DIAGNOSIS — N921 Excessive and frequent menstruation with irregular cycle: Secondary | ICD-10-CM | POA: Insufficient documentation

## 2021-08-04 DIAGNOSIS — Z01419 Encounter for gynecological examination (general) (routine) without abnormal findings: Secondary | ICD-10-CM | POA: Insufficient documentation

## 2021-08-04 DIAGNOSIS — N939 Abnormal uterine and vaginal bleeding, unspecified: Secondary | ICD-10-CM | POA: Diagnosis not present

## 2021-08-04 DIAGNOSIS — Z975 Presence of (intrauterine) contraceptive device: Secondary | ICD-10-CM | POA: Insufficient documentation

## 2021-08-04 DIAGNOSIS — N841 Polyp of cervix uteri: Secondary | ICD-10-CM | POA: Insufficient documentation

## 2021-08-04 DIAGNOSIS — B3731 Acute candidiasis of vulva and vagina: Secondary | ICD-10-CM | POA: Diagnosis not present

## 2021-08-04 DIAGNOSIS — L600 Ingrowing nail: Secondary | ICD-10-CM

## 2021-08-04 DIAGNOSIS — Z789 Other specified health status: Secondary | ICD-10-CM

## 2021-08-04 HISTORY — PX: POLYPECTOMY: SHX149

## 2021-08-04 HISTORY — DX: Polyp of cervix uteri: N84.1

## 2021-08-04 MED ORDER — NEOMYCIN-POLYMYXIN-HC 1 % OT SOLN: OTIC | 0 refills | Status: DC

## 2021-08-04 MED ORDER — ETONOGESTREL-ETHINYL ESTRADIOL 0.12-0.015 MG/24HR VA RING: VAGINAL_RING | VAGINAL | 3 refills | Status: DC

## 2021-08-04 NOTE — Patient Instructions (Signed)
Coloque 1/4 taza de sales de Epsom en un cuarto de galn de agua tibia del grifo. Sumerja su pie o pies en la solucin y djelos en remojo durante 20 minutos. Este remojo debe hacerse dos veces al da. Luego, retire su pie o pies de la solucin, seque el rea afectada. Aplique ungento y cubra si se lo indica su mdico.  SI SU PIEL SE IRRITA MIENTRAS UTILIZA ESTAS INSTRUCCIONES, PUEDE CAMBIAR A VINAGRE BLANCO Y AGUA. Como otra alternativa de remojo, puede usar agua y jabn antibacteriano.  Controle cualquier signo/sntoma de infeccin. Llame a la oficina inmediatamente si ocurre algo o vaya directamente a la sala de emergencias. Llame con cualquier pregunta/inquietud.. 

## 2021-08-04 NOTE — Progress Notes (Signed)
Obstetrics and Gynecology ?Annual Patient Evaluation ? ?Appointment Date: 08/04/2021 ? ?OBGYN Clinic: [facility] ? ?Primary Care Provider: Vernona Rieger K ? ? ?Chief Complaint:  ?Chief Complaint  ?Patient presents with  ? Gynecologic Exam  ? ? ?History of Present Illness: Crystal Graves is a 36 y.o. Hispanic [phone] (No LMP recorded.), seen for the above chief complaint.  ? ?Patient had nexplanon placed in April 2022 and has had AUB with it. She was seen in 04/2021 and the provider recommended a 14m trial of OCPs, no exam done; self swab done in 05/2021 and it was only positive for yeast.  Pt states she finished it up but  it didn't help with the AUB. She states she still feels she has 3 periods a month, no pain, and is having some bleeding today.  ? ?Review of Systems: Pertinent items noted in HPI and remainder of comprehensive ROS otherwise negative.  ? ?Patient Active Problem List  ? Diagnosis Date Noted  ? Language barrier, speaks Spanish only 11/10/2014  ? ? ?Past Medical History:  ?Past Medical History:  ?Diagnosis Date  ? ASCUS with positive high risk HPV cervical   ? Chlamydia infection 06/29/2017  ? Moderate dysplasia of cervix (CIN II) 11/11/2014  ? CIN II noted on colposcopy done after LGSIL pap on 11/10/14 Cryotherapy done on 12/10/14 06/22/15 Normal cytology on pap smear with positive HRHPV, but negative HPV 16 and 18/45 -> repeat cotesting in one year 06/27/16  ASCUS +HPV  Colposcopy showed CIN II, LEEP recommeded LEEP on 09/05/2016 showed CIN I.   ? ? ?Past Surgical History:  ?Past Surgical History:  ?Procedure Laterality Date  ? COLPOSCOPY W/ BIOPSY / CURETTAGE  11/10/2014  ? Done for LGSIL pap smear  ? GYNECOLOGIC CRYOSURGERY  12/10/2014  ? Done for CIN II (colposcopy pathology after LGSIL pap)  ? INSERTION OF IMPLANON ROD  09/21/2020  ?    ? ? ?Past Obstetrical History:  ?OB History  ?Gravida Para Term Preterm AB Living  ?4 3 3  0 1 3  ?SAB IAB Ectopic Multiple Live  Births  ?1 0 0 0 3  ?  ?# Outcome Date GA Lbr Len/2nd Weight Sex Delivery Anes PTL Lv  ?4 Term 06/07/20 105w3d  6 lb 6.3 oz (2.9 kg) F Vag-Spont None N LIV  ?   Birth Comments: [facility]  ?3 SAB 02/2019          ?2 Term 06/28/10    M Vag-Spont   LIV  ?1 Term 09/15/05    M Vag-Spont   LIV  ? ? ?Past Gynecological History: As per HPI. ?History of Pap Smear(s): Yes.   Last pap 01/2019, which was negative and hpv negative ? ?Social History:  ?Social History  ? ?Socioeconomic History  ? Marital status: Single  ?  Spouse name: Not on file  ? Number of children: Not on file  ? Years of education: Not on file  ? Highest education level: Not on file  ?Occupational History  ? Not on file  ?Tobacco Use  ? Smoking status: Never  ? Smokeless tobacco: Never  ?Vaping Use  ? Vaping Use: Never used  ?Substance and Sexual Activity  ? Alcohol use: Yes  ?  Alcohol/week: 0.0 standard drinks  ?  Comment: Occasional  ? Drug use: No  ? Sexual activity: Yes  ?  Birth control/protection: Pill  ?Other Topics Concern  ? Not on file  ?Social History Narrative  ?  Single.  ? 2 children.  ? Works as a Chartered certified accountant.  ? She enjoys dancing, going to the movies.   ? ?Social Determinants of Health  ? ?Financial Resource Strain: Not on file  ?Food Insecurity: Not on file  ?Transportation Needs: Not on file  ?Physical Activity: Not on file  ?Stress: Not on file  ?Social Connections: Not on file  ?Intimate Partner Violence: Not on file  ? ? ?Family History:  ?Family History  ?Problem Relation Age of Onset  ? Hypertension Father   ? ? ?Medications ?Nexplanon ? ? ?Allergies ?Patient has no known allergies. ? ? ?Physical Exam:  ?BP 116/78   Pulse 76   Wt 133 lb (60.3 kg)   BMI 21.47 kg/m?  Body mass index is 21.47 kg/m?. ?General appearance: Well nourished, well developed female in no acute distress.  ?Neck:  Supple, normal appearance, and no thyromegaly  ?Cardiovascular: normal s1 and s2.  No murmurs, rubs or gallops. ?Respiratory:  Clear  to auscultation bilateral. Normal respiratory effort ?Abdomen: positive bowel sounds and no masses, hernias; diffusely non tender to palpation, non distended ?Breasts: breasts appear normal, no suspicious masses, no skin or nipple changes or axillary nodes, and normal palpation. ?Neuro/Psych:  Normal mood and affect.  ?Skin:  Warm and dry.  ?Lymphatic:  No inguinal lymphadenopathy.  ? ?Pelvic exam: is not limited by body habitus ?EGBUS: within normal limits ?Vagina: scant light brown d/c in vault ?Cervix: small endocervical polyp noted flush with the external os (see procedure note for removal) ?Uterus:  nonenlarged and non tender ?Adnexa:  normal adnexa and no mass, fullness, tenderness ?Rectovaginal: deferred ? ?Laboratory: none ? ?Radiology: none ? ?Assessment: pt stable ? ?Plan:  ?1. Well woman exam with routine gynecological exam ?- Cervicovaginal ancillary only( [place]) ? ?2. Breakthrough bleeding on Nexplanon ?I told her I recommend polyp removal and leaving the nexplanon to see if the polyp could be the cause, which she was amenable to. Pelvic rest until f/u visit ? ?If no change, then will remove and patient desiring to try nuvaring or OCPs ?- Cervicovaginal ancillary only( [place]) ? ?3. Language barrier ?Interpreter used ? ?4. Endocervical polyp ?See above ? ?RTC 3-4wks ? ?Cornelia Copa MD ?Attending ?[facility] [employer] [facility]) ? ?

## 2021-08-04 NOTE — Procedures (Signed)
Endocervical Polypectomy Procedure Note ? ?Pre-operative Diagnosis: AUB. Endocervical polyp ? ?Post-operative Diagnosis: same ? ?Procedure Details  The risks (including infection, bleeding, pain, and uterine perforation) and benefits of the procedure were explained to the patient and Written informed consent was obtained. ? ?The patient was placed in the dorsal lithotomy position.  Bimanual exam showed the uterus to be in the neutral position.  A Graves' speculum inserted in the vagina, and the cervix was visualized with a small, subcm endocervical polyp noted flush with the external os; a pap smear and vaginal swab was then performed. The cervix was then prepped with povidone iodine, and a sharp tenaculum was applied to the anterior lip of the cervix for stabilization and 62mL of 1% lidocaine with epi used at the area. Using kelly clamps and endocervical curettage instrument most of the polyp was removed and what was left was cauterized with silver nirate. A Minimal amount of tissue was collected. The sample was sent for pathologic examination. ? ?Condition: ?Stable ? ?Complications: ?None ? ?Plan: ?The patient was advised to call for any fever or for prolonged or severe pain or bleeding. She was advised to use OTC analgesics as needed for mild to moderate pain.  ? ?Durene Romans MD ?Attending ?[facility] [employer] Fish farm manager) ? ?

## 2021-08-05 LAB — CERVICOVAGINAL ANCILLARY ONLY
Bacterial Vaginitis (gardnerella): NEGATIVE
Candida Glabrata: NEGATIVE
Candida Vaginitis: POSITIVE — AB
Chlamydia: NEGATIVE
Comment: NEGATIVE
Comment: NEGATIVE
Comment: NEGATIVE
Comment: NEGATIVE
Comment: NEGATIVE
Comment: NORMAL
Neisseria Gonorrhea: NEGATIVE
Trichomonas: NEGATIVE

## 2021-08-05 LAB — CYTOLOGY - PAP
Comment: NEGATIVE
Diagnosis: NEGATIVE
High risk HPV: NEGATIVE

## 2021-08-05 MED ORDER — FLUCONAZOLE 150 MG PO TABS: 150.0000 mg | ORAL_TABLET | Freq: Once | ORAL | 0 refills | Status: AC

## 2021-08-05 NOTE — Addendum Note (Signed)
Addended by: [place] Bing on: 08/05/2021 09:17 PM ? ? Modules accepted: Orders ? ?

## 2021-08-08 ENCOUNTER — Encounter: Payer: Self-pay | Admitting: Podiatry

## 2021-08-08 NOTE — Progress Notes (Signed)
?  Subjective:  ?Patient ID: Crystal Graves, female    DOB: Feb 21, 1986,  MRN: [id number] ? ?Chief Complaint  ?Patient presents with  ? Nail Problem  ?  Bilat hallux- both borders of nails have been a problem since sh can remember. Also tries to get the minimal of nail out but it's recurring   ? ? ?36 y.o. female presents with the above complaint. History confirmed with patient.  ? ?Objective:  ?Physical Exam: ?warm, good capillary refill, no trophic changes or ulcerative lesions, normal DP and PT pulses, normal sensory exam, and ingrowing lateral and medial borders of both hallux nails no paronychia noted. ? ?Assessment:  ? ?1. Ingrowing left great toenail   ?2. Ingrowing right great toenail   ? ? ? ?Plan:  ?Patient was evaluated and treated and all questions answered. ? ? ? ?Ingrown Nail, bilaterally ?-Patient elects to proceed with minor surgery to remove ingrown toenail today. Consent reviewed and signed by patient. ?-Ingrown nail excised. See procedure note. ?-Educated on post-procedure care including soaking. Written instructions provided and reviewed. ?-Patient to follow up in 2 weeks for nail check. ? ?Procedure: Excision of Ingrown Toenail ?Location: Bilateral 1st toe  medial and lateral  nail borders. ?Anesthesia: Lidocaine 1% plain; 1.5 mL and Marcaine 0.5% plain; 1.5 mL, digital block. ?Skin Prep: Betadine. ?Dressing: Silvadene; telfa; dry, sterile, compression dressing. ?Technique: Following skin prep, the toe was exsanguinated and a tourniquet was secured at the base of the toe. The affected nail border was freed, split with a nail splitter, and excised. Chemical matrixectomy was then performed with phenol and irrigated out with alcohol. The tourniquet was then removed and sterile dressing applied. ?Disposition: Patient tolerated procedure well. Patient to return in 2 weeks for follow-up.  ? ? ?Return in about 2 weeks (around 08/18/2021) for nail re-check.  ? ?

## 2021-08-18 ENCOUNTER — Other Ambulatory Visit: Payer: Self-pay

## 2021-08-18 ENCOUNTER — Ambulatory Visit: Payer: BC Managed Care – PPO | Admitting: Podiatry

## 2021-08-18 DIAGNOSIS — L600 Ingrowing nail: Secondary | ICD-10-CM

## 2021-08-23 NOTE — Progress Notes (Signed)
?  Subjective:  ?Patient ID: Aura Dials, female    DOB: 08-30-1985,  MRN: [id number] ? ?Chief Complaint  ?Patient presents with  ? Ingrown Toenail  ?  Nail check left  ? ? ?36 y.o. female presents with the above complaint. History confirmed with patient.  Doing well not having much pain or drainage ? ?Objective:  ?Physical Exam: ?warm, good capillary refill, no trophic changes or ulcerative lesions, normal DP and PT pulses, normal sensory exam, and bilateral hallux matricectomy sites are healing well ? ?Assessment:  ? ?1. Ingrowing left great toenail   ?2. Ingrowing right great toenail   ? ? ? ? ?Plan:  ?Patient was evaluated and treated and all questions answered. ? ? ? ?Ingrown Nail, bilaterally ?-Overall doing quite well she can leave open to air and discontinue soaks and ointments at this point.  I will see her back as needed for this or other issues ? ? ?Return if symptoms worsen or fail to improve.  ? ?

## 2021-09-07 ENCOUNTER — Ambulatory Visit: Payer: BC Managed Care – PPO | Admitting: Obstetrics and Gynecology

## 2021-09-07 ENCOUNTER — Encounter: Payer: Self-pay | Admitting: Obstetrics and Gynecology

## 2021-09-07 VITALS — BP 113/75 | HR 68 | Ht 66.0 in | Wt 134.4 lb

## 2021-09-07 DIAGNOSIS — N921 Excessive and frequent menstruation with irregular cycle: Secondary | ICD-10-CM | POA: Diagnosis not present

## 2021-09-07 DIAGNOSIS — Z30018 Encounter for initial prescription of other contraceptives: Secondary | ICD-10-CM | POA: Diagnosis not present

## 2021-09-07 DIAGNOSIS — Z9889 Other specified postprocedural states: Secondary | ICD-10-CM | POA: Diagnosis not present

## 2021-09-07 DIAGNOSIS — Z789 Other specified health status: Secondary | ICD-10-CM

## 2021-09-07 DIAGNOSIS — Z975 Presence of (intrauterine) contraceptive device: Secondary | ICD-10-CM

## 2021-09-07 DIAGNOSIS — Z8742 Personal history of other diseases of the female genital tract: Secondary | ICD-10-CM | POA: Diagnosis not present

## 2021-09-07 HISTORY — PX: REMOVAL OF IMPLANON ROD: OBO 1006

## 2021-09-07 MED ORDER — ETONOGESTREL-ETHINYL ESTRADIOL 0.12-0.015 MG/24HR VA RING: VAGINAL_RING | VAGINAL | 6 refills | Status: DC

## 2021-09-07 MED ORDER — LEVONORGESTREL 1.5 MG PO TABS: 1.5000 mg | ORAL_TABLET | Freq: Once | ORAL | 0 refills | Status: AC

## 2021-09-07 NOTE — Procedures (Signed)
Nexplanon Removal Procedure Note ?Prior to the procedure being performed, the patient (or guardian) was asked to state their full name, date of birth, type of procedure being performed and the exact location of the operative site. This information was then checked against the documentation in the patient's chart. Prior to the procedure being performed, a "time out" was performed by the physician that confirmed the correct patient, procedure and site. ? ?After informed consent was obtained, the patient's left arm was examined and the Nexplanon rod was noted to be easily palpable. The area was cleaned with alcohol then local anesthesia was infiltrated with 3 ml of 1% lidocaine with epinephrine. The area was prepped with betadine. Using sterile technique, the Nexplanon device was brought to the incision site. The capsule was scrapped off with the scalpel, the Nexplanon grasped with hemostats, and easily removed; the removal site was hemostatic. The Nexplanon was inspected and noted to be intact.  A steri-strip and a pressure dressing was applied. ? ?The patient tolerated the procedure well. ? ?She chose to do Nuvaring for birth control.  ? ?Durene Romans MD ?Attending ?[facility] [employer] Fish farm manager) ? ?

## 2021-09-07 NOTE — Progress Notes (Addendum)
Obstetrics and Gynecology Visit ?Return Patient Evaluation ? ?Appointment Date: 09/07/2021 ? ?Primary Care Provider: Doreene Nest ? ?OBGYN Clinic: [facility]  ? ?Chief Complaint: follow up AUB with nexplanon in place and s/p polypectomy ? ?History of Present Illness:  Crystal Graves is a 36 y.o. seen on 3/2 for AUB with nexplanon in place. Previously tried on OCPs but no change. On 3/2, I did an exam and polyp noted which was removed and plan was to keep nexplanon in place and see how bleeding is and f/u in one month. Pap and polyp pathology negative. ? ?Interval History: Since that time, she states that no change in AUB. Last sex last night. Pt desires nexplanon removal ? ?Review of Systems:  as noted in the History of Present Illness. ? ? ?Medications:  ?[employer] had no medications administered during this visit. ?Current Outpatient Medications  ?Medication Sig Dispense Refill  ? NEOMYCIN-POLYMYXIN-HYDROCORTISONE (CORTISPORIN) 1 % SOLN OTIC solution Apply to nail beds from procedure site twice daily after soaks (Patient not taking: Reported on 09/07/2021) 10 mL 0  ? ?Current Facility-Administered Medications  ?Medication Dose Route Frequency Provider Last Rate Last Admin  ? etonogestrel (NEXPLANON) implant 68 mg  68 mg Subdermal Once [place] Bing, MD      ? ? ?Allergies: has No Known Allergies. ? ?Physical Exam:  ?BP 113/75   Pulse 68   Ht 5\' 6"  (1.676 m)   Wt 134 lb 6.4 oz (61 kg)   Breastfeeding No   BMI 21.69 kg/m?  Body mass index is 21.69 kg/m?. ?General appearance: Well nourished, well developed female in no acute distress.  ?Abdomen: diffusely non tender to palpation, non distended, and no masses, hernias ?Neuro/Psych:  Normal mood and affect.   ? ?Pelvic exam:  ?EGBUS: normal ?Vaginal vault: scant old blood in vault ?Cervix:  normal. No e/o polyp noted ? ?See procedure note for nexplanon removal  ? ? ?Assessment: pt stable ? ?Plan: ?Pt  had unprotected sex last night and I told her ideally would wait a week to remove but pt desires removal today. Pt amenable to Plan B and to start nuvaring today and she aware of pregnancy risk ? ?In person interpreter used ? ?RTC: 7m to see how AUB is ? ?1m MD ?Attending ?[facility] for Cornelia Copa [employer]) ? ? ?

## 2021-11-09 ENCOUNTER — Telehealth: Payer: BC Managed Care – PPO | Admitting: Physician Assistant

## 2021-11-09 ENCOUNTER — Encounter: Payer: Self-pay | Admitting: Obstetrics and Gynecology

## 2021-11-09 ENCOUNTER — Other Ambulatory Visit: Payer: Self-pay | Admitting: *Deleted

## 2021-11-09 DIAGNOSIS — N898 Other specified noninflammatory disorders of vagina: Secondary | ICD-10-CM

## 2021-11-09 MED ORDER — FLUCONAZOLE 150 MG PO TABS: 150.0000 mg | ORAL_TABLET | Freq: Once | ORAL | 3 refills | Status: AC

## 2021-11-09 NOTE — Progress Notes (Signed)
[place]   Needs in person discharge testing to rule out STI   Message sent on this EV

## 2021-11-09 NOTE — Progress Notes (Signed)
Because of yellow/green discharge as well, I feel your condition warrants further evaluation and I recommend that you be seen in a face to face visit. I am concerned there may be more than one thing contributing to symptoms and therefore vaginal/urine testing is needed.    NOTE: There will be NO CHARGE for this eVisit   If you are having a true medical emergency please call 911.      For an urgent face to face visit, [facility] has six urgent care centers for your convenience:     [facility] Center at [facility] Directions [phone] [address] [place], [place] [address]    [facility] Health Urgent [facility] Center [facility]) Get Driving Directions [phone] [address] [place], [place] [address]  [facility] Health Urgent Care Center [facility]) Get Driving Directions [phone] [address] [place],  [place]  [address]  [facility] Health Urgent Care at [facility] Get Driving Directions [phone] [address] [place], [place] [address]   [facility] Health Urgent Care at [facility] Get Driving Directions  [phone] [address] [place], [place] [address]   [facility] Health Urgent Care at [facility] Directions [phone] [address] [place], [place] [address]  Your MyChart E-visit questionnaire answers were reviewed by a board certified advanced clinical practitioner to complete your personal care plan based on your specific symptoms.  Thank you for using e-Visits.

## 2021-11-21 ENCOUNTER — Ambulatory Visit (INDEPENDENT_AMBULATORY_CARE_PROVIDER_SITE_OTHER): Payer: BC Managed Care – PPO

## 2021-11-21 VITALS — BP 110/73 | HR 78

## 2021-11-21 DIAGNOSIS — N912 Amenorrhea, unspecified: Secondary | ICD-10-CM

## 2021-11-21 LAB — POCT URINE PREGNANCY: Preg Test, Ur: POSITIVE — AB

## 2021-11-21 NOTE — Progress Notes (Signed)
[employer] here for a UPT. Pt had a positive upt at home. LMP is May patient is unsure of date.     UPT in office line is very faint line barely seen.     Reviewed medications and informed to start a PNV, if not already. Pt to follow up in 1- 2 weeks for Repeat UPT.

## 2021-11-29 ENCOUNTER — Ambulatory Visit (INDEPENDENT_AMBULATORY_CARE_PROVIDER_SITE_OTHER): Payer: BC Managed Care – PPO | Admitting: *Deleted

## 2021-11-29 VITALS — BP 118/78 | HR 65 | Wt 136.0 lb

## 2021-11-29 DIAGNOSIS — N912 Amenorrhea, unspecified: Secondary | ICD-10-CM | POA: Diagnosis not present

## 2021-11-29 LAB — POCT URINE PREGNANCY: Preg Test, Ur: POSITIVE — AB

## 2021-12-20 ENCOUNTER — Ambulatory Visit (INDEPENDENT_AMBULATORY_CARE_PROVIDER_SITE_OTHER): Payer: BC Managed Care – PPO | Admitting: *Deleted

## 2021-12-20 ENCOUNTER — Ambulatory Visit (INDEPENDENT_AMBULATORY_CARE_PROVIDER_SITE_OTHER): Payer: BC Managed Care – PPO

## 2021-12-20 VITALS — BP 112/69 | HR 70 | Wt 140.0 lb

## 2021-12-20 DIAGNOSIS — O3680X0 Pregnancy with inconclusive fetal viability, not applicable or unspecified: Secondary | ICD-10-CM

## 2021-12-20 DIAGNOSIS — Z3480 Encounter for supervision of other normal pregnancy, unspecified trimester: Secondary | ICD-10-CM | POA: Insufficient documentation

## 2021-12-20 NOTE — Progress Notes (Signed)
New OB Intake  I explained I am completing New OB Intake today. We discussed her EDD of 08/06/2022 that is based on today's scan. LMP of middle of may. Pt is G5/P3. I reviewed her allergies, medications, Medical/Surgical/OB history, and appropriate screenings.   Patient Active Problem List   Diagnosis Date Noted   Supervision of other normal pregnancy, antepartum 12/20/2021   Language barrier, speaks Spanish only 11/10/2014    Concerns addressed today  Delivery Plans:  Plans to deliver at [facility].      Anatomy US Explained first scheduled [place] will be around 19 weeks.   Labs Routine prenatal labs needed.   Placed OB Box on problem list and updated   Patient informed that the ultrasound is considered a limited obstetric ultrasound and is not intended to be a complete ultrasound exam.  Patient also informed that the ultrasound is not being completed with the intent of assessing for fetal or placental anomalies or any pelvic abnormalities. Explained that the purpose of today's ultrasound is to assess for dating and fetal heart rate.  Patient acknowledges the purpose of the exam and the limitations of the study.      First visit review I reviewed new OB appt with pt. Explained pt will be seen by Dr Macon Large at first visit.    Scheryl Marten, RN 12/20/2021  4:16 PM

## 2022-01-10 ENCOUNTER — Encounter: Payer: Self-pay | Admitting: Obstetrics & Gynecology

## 2022-01-10 ENCOUNTER — Ambulatory Visit (INDEPENDENT_AMBULATORY_CARE_PROVIDER_SITE_OTHER): Payer: BC Managed Care – PPO | Admitting: Obstetrics & Gynecology

## 2022-01-10 ENCOUNTER — Other Ambulatory Visit (HOSPITAL_COMMUNITY)
Admission: RE | Admit: 2022-01-10 | Discharge: 2022-01-10 | Disposition: A | Discharge status: Home / Self Care | Payer: BC Managed Care – PPO | Source: Ambulatory Visit | Attending: Obstetrics & Gynecology | Admitting: Obstetrics & Gynecology

## 2022-01-10 VITALS — BP 119/71 | HR 59 | Wt 139.0 lb

## 2022-01-10 DIAGNOSIS — Z302 Encounter for sterilization: Secondary | ICD-10-CM | POA: Insufficient documentation

## 2022-01-10 DIAGNOSIS — Z3480 Encounter for supervision of other normal pregnancy, unspecified trimester: Secondary | ICD-10-CM | POA: Insufficient documentation

## 2022-01-10 DIAGNOSIS — Z3A10 10 weeks gestation of pregnancy: Secondary | ICD-10-CM | POA: Insufficient documentation

## 2022-01-10 DIAGNOSIS — Z3481 Encounter for supervision of other normal pregnancy, first trimester: Secondary | ICD-10-CM

## 2022-01-10 NOTE — Progress Notes (Signed)
History:   Crystal Graves is a 36 y.o. [phone] at [redacted]w[redacted]d by early ultrasound not consistent with LMP being seen today for her first obstetrical visit.  Patient is Spanish-speaking only, interpreter present for this encounter. Her obstetrical history is significant for  three term SVDs, no complications . Patient does intend to breast feed. Pregnancy history fully reviewed.  Patient reports no complaints.  She wants a BTL after this pregnancy.      HISTORY: OB History  Gravida Para Term Preterm AB Living  5 3 3  0 1 3  SAB IAB Ectopic Multiple Live Births  1 0 0 0 3    # Outcome Date GA Lbr Len/2nd Weight Sex Delivery Anes PTL Lv  5 Current           4 Term 06/07/20 [redacted]w[redacted]d  6 lb 6.3 oz (2.9 kg) F Vag-Spont None N LIV     Birth Comments: [facility]     Apgar1: 8  Apgar5: 9  3 SAB 02/2019          2 Term 06/28/10    M Vag-Spont   LIV  1 Term 09/15/05    M Vag-Spont   LIV    Last pap smear was done 08/04/2021 and was normal  Past Medical History:  Diagnosis Date   ASCUS with positive high risk HPV cervical    Chlamydia infection 06/29/2017   Endocervical polyp 08/04/2021   Moderate dysplasia of cervix (CIN II) 11/11/2014   CIN II noted on colposcopy done after LGSIL pap on 11/10/14 Cryotherapy done on 12/10/14 06/22/15 Normal cytology on pap smear with positive HRHPV, but negative HPV 16 and 18/45 -> repeat cotesting in one year 06/27/16  ASCUS +HPV  Colposcopy showed CIN II, LEEP recommeded LEEP on 09/05/2016 showed CIN I.    Past Surgical History:  Procedure Laterality Date   COLPOSCOPY W/ BIOPSY / CURETTAGE  11/10/2014   Done for LGSIL pap smear   GYNECOLOGIC CRYOSURGERY  12/10/2014   Done for CIN II (colposcopy pathology after LGSIL pap)   INSERTION OF IMPLANON ROD  09/21/2020       POLYPECTOMY  08/04/2021   REMOVAL OF IMPLANON ROD  09/07/2021   Family History  Problem Relation Age of Onset   Hypertension Father    Social History   Tobacco Use   Smoking  status: Never   Smokeless tobacco: Never  Vaping Use   Vaping Use: Never used  Substance Use Topics   Alcohol use: Yes    Alcohol/week: 0.0 standard drinks of alcohol    Comment: Occasional   Drug use: No   No Known Allergies Current Outpatient Medications on File Prior to Visit  Medication Sig Dispense Refill   Prenatal Vit-Fe Fumarate-FA (MULTIVITAMIN-PRENATAL) 27-0.8 MG TABS tablet Take 1 tablet by mouth daily at 12 noon.     No current facility-administered medications on file prior to visit.    Review of Systems Pertinent items noted in HPI and remainder of comprehensive ROS otherwise negative.  Physical Exam:   Vitals:   01/10/22 1440  BP: 119/71  Pulse: (!) 59  Weight: 139 lb (63 kg)   Fetal Heart Rate (bpm): 160   General: well-developed, well-nourished female in no acute distress  Breasts:  deferred  Skin: normal coloration and turgor, no rashes  Neurologic: oriented, normal, negative, normal mood  Extremities: normal strength, tone, and muscle mass, ROM of all joints is normal  HEENT PERRLA, extraocular movement intact and sclera  clear, anicteric  Neck supple and no masses  Cardiovascular: regular rate and rhythm  Respiratory:  no respiratory distress, normal breath sounds  Abdomen: soft, non-tender; bowel sounds normal; no masses,  no organomegaly  Pelvic: deferred    Assessment:    Pregnancy: [id number] Patient Active Problem List   Diagnosis Date Noted   Request for sterilization 01/10/2022   Supervision of other normal pregnancy, antepartum 12/20/2021   Language barrier, speaks Spanish only 11/10/2014     Plan:    1. [redacted] weeks gestation of pregnancy 2. Supervision of other normal pregnancy, antepartum - CBC/D/Plt+RPR+Rh+ABO+RubIgG... - Culture, OB Urine - Hemoglobin A1c - Cervicovaginal ancillary only - [place] MFM OB COMP + 14 WK; Future - PANORAMA PRENATAL TEST FULL PANEL Initial labs drawn. Continue prenatal vitamins. Problem list reviewed and  updated. Genetic Screening discussed, Panorama: ordered. Ultrasound discussed; fetal anatomic survey: planned. Anticipatory guidance about prenatal visits given including labs, ultrasounds, and testing. Weight gain recommendations per IOM guidelines reviewed: underweight/BMI 18.5 or less > 28 - 40 lbs; normal weight/BMI 18.5 - 24.9 > 25 - 35 lbs; overweight/BMI 25 - 29.9 > 15 - 25 lbs; obese/BMI  30 or more > 11 - 20 lbs. Discussed usage of the Babys[employer]s app for more information about pregnancy, and to track blood pressures. Also discussed usage of virtual visits as additional source of managing and completing prenatal visits.  Patient was encouraged to use MyChart to review results, send requests, and have questions addressed.   The nature of [place] [facility] Healthcare/Faculty Practice with multiple MDs and Advanced Practice Providers was explained to patient; also emphasized that residents, students are part of our team. Routine obstetric precautions reviewed. Encouraged to seek out care at office or emergency room [facility] preferred) for urgent and/or emergent concerns. Return in about 4 weeks (around 02/07/2022) for OFFICE OB VISIT (MD or APP).     [name], MD, FACOG Obstetrician & Gynecologist, [facility] for [employer], [facility]

## 2022-01-12 LAB — CBC/D/PLT+RPR+RH+ABO+RUBIGG...
Antibody Screen: NEGATIVE
Basophils Absolute: 0 10*3/uL (ref 0.0–0.2)
Basos: 0 %
EOS (ABSOLUTE): 0.3 10*3/uL (ref 0.0–0.4)
Eos: 5 %
HCV Ab: NONREACTIVE
HIV Screen 4th Generation wRfx: NONREACTIVE
Hematocrit: 38.7 % (ref 34.0–46.6)
Hemoglobin: 12.9 g/dL (ref 11.1–15.9)
Hepatitis B Surface Ag: NEGATIVE
Immature Grans (Abs): 0 10*3/uL (ref 0.0–0.1)
Immature Granulocytes: 0 %
Lymphocytes Absolute: 1.8 10*3/uL (ref 0.7–3.1)
Lymphs: 26 %
MCH: 30.5 pg (ref 26.6–33.0)
MCHC: 33.3 g/dL (ref 31.5–35.7)
MCV: 92 fL (ref 79–97)
Monocytes Absolute: 0.4 10*3/uL (ref 0.1–0.9)
Monocytes: 6 %
Neutrophils Absolute: 4.4 10*3/uL (ref 1.4–7.0)
Neutrophils: 63 %
Platelets: 229 10*3/uL (ref 150–450)
RBC: 4.23 x10E6/uL (ref 3.77–5.28)
RDW: 12.6 % (ref 11.7–15.4)
RPR Ser Ql: NONREACTIVE
Rh Factor: POSITIVE
Rubella Antibodies, IGG: 1.96 index (ref >0.99)
WBC: 7 10*3/uL (ref 3.4–10.8)

## 2022-01-12 LAB — HEMOGLOBIN A1C
Est. average glucose Bld gHb Est-mCnc: 105 mg/dL
Hgb A1c MFr Bld: 5.3 % (ref 4.8–5.6)

## 2022-01-12 LAB — CERVICOVAGINAL ANCILLARY ONLY
Chlamydia: NEGATIVE
Comment: NEGATIVE
Comment: NORMAL
Neisseria Gonorrhea: NEGATIVE

## 2022-01-12 LAB — CULTURE, OB URINE

## 2022-01-12 LAB — URINE CULTURE, OB REFLEX

## 2022-01-12 LAB — HCV INTERPRETATION

## 2022-01-17 LAB — PANORAMA PRENATAL TEST FULL PANEL:PANORAMA TEST PLUS 5 ADDITIONAL MICRODELETIONS: FETAL FRACTION: 8.4

## 2022-02-14 ENCOUNTER — Ambulatory Visit (INDEPENDENT_AMBULATORY_CARE_PROVIDER_SITE_OTHER): Payer: BC Managed Care – PPO | Admitting: Obstetrics & Gynecology

## 2022-02-14 VITALS — BP 99/64 | HR 71 | Wt 141.0 lb

## 2022-02-14 DIAGNOSIS — Z3480 Encounter for supervision of other normal pregnancy, unspecified trimester: Secondary | ICD-10-CM

## 2022-02-14 DIAGNOSIS — Z3A15 15 weeks gestation of pregnancy: Secondary | ICD-10-CM

## 2022-02-14 DIAGNOSIS — Z3482 Encounter for supervision of other normal pregnancy, second trimester: Secondary | ICD-10-CM

## 2022-02-14 NOTE — Progress Notes (Signed)
   PRENATAL VISIT NOTE  Subjective:  Crystal Graves is a 36 y.o. [phone] at [redacted]w[redacted]d being seen today for ongoing prenatal care. Patient is Spanish-speaking only, interpreter present for this encounter.  She is currently monitored for the following issues for this low-risk pregnancy and has Language barrier, speaks Spanish only; Supervision of other normal pregnancy, antepartum; and Request for sterilization on their problem list.  Patient reports no complaints.  Contractions: Not present. Vag. Bleeding: None.   . Denies leaking of fluid.   The following portions of the patient's history were reviewed and updated as appropriate: allergies, current medications, past family history, past medical history, past social history, past surgical history and problem list.   Objective:   Vitals:   02/14/22 1408  BP: 99/64  Pulse: 71  Weight: 141 lb (64 kg)    Fetal Status: Fetal Heart Rate (bpm): 150         General:  Alert, oriented and cooperative. Patient is in no acute distress.  Skin: Skin is warm and dry. No rash noted.   Cardiovascular: Normal heart rate noted  Respiratory: Normal respiratory effort, no problems with respiration noted  Abdomen: Soft, gravid, appropriate for gestational age.  Pain/Pressure: Absent     Pelvic: Cervical exam deferred        Extremities: Normal range of motion.  Edema: None  Mental Status: Normal mood and affect. Normal behavior. Normal judgment and thought content.   Assessment and Plan:  Pregnancy: G5P3013 at [redacted]w[redacted]d 1. [redacted] weeks gestation of pregnancy 2. Supervision of other normal pregnancy, antepartum Declined AFP, anatomy scan to be scheduled.   No other complaints or concerns.  Routine obstetric precautions reviewed.  Please refer to After Visit Summary for other counseling recommendations.   Return in about 4 weeks (around 03/14/2022) for OFFICE OB VISIT (MD or APP).  Future Appointments  Date Time Provider Department Center   03/14/2022  1:30 PM  Bing, MD [facility]    [name], MD

## 2022-03-14 ENCOUNTER — Ambulatory Visit (INDEPENDENT_AMBULATORY_CARE_PROVIDER_SITE_OTHER): Payer: BC Managed Care – PPO | Admitting: Obstetrics and Gynecology

## 2022-03-14 ENCOUNTER — Other Ambulatory Visit: Payer: Self-pay | Admitting: Obstetrics & Gynecology

## 2022-03-14 ENCOUNTER — Ambulatory Visit: Payer: BC Managed Care – PPO | Attending: Obstetrics & Gynecology

## 2022-03-14 ENCOUNTER — Ambulatory Visit: Payer: BC Managed Care – PPO | Admitting: *Deleted

## 2022-03-14 VITALS — BP 106/62 | HR 76

## 2022-03-14 VITALS — BP 106/67 | HR 76 | Wt 142.0 lb

## 2022-03-14 DIAGNOSIS — Z3482 Encounter for supervision of other normal pregnancy, second trimester: Secondary | ICD-10-CM

## 2022-03-14 DIAGNOSIS — Z3480 Encounter for supervision of other normal pregnancy, unspecified trimester: Secondary | ICD-10-CM

## 2022-03-14 DIAGNOSIS — Z3A15 15 weeks gestation of pregnancy: Secondary | ICD-10-CM

## 2022-03-14 DIAGNOSIS — O321XX0 Maternal care for breech presentation, not applicable or unspecified: Secondary | ICD-10-CM | POA: Diagnosis not present

## 2022-03-14 DIAGNOSIS — Z789 Other specified health status: Secondary | ICD-10-CM

## 2022-03-14 DIAGNOSIS — O09522 Supervision of elderly multigravida, second trimester: Secondary | ICD-10-CM | POA: Diagnosis not present

## 2022-03-14 DIAGNOSIS — Z3A19 19 weeks gestation of pregnancy: Secondary | ICD-10-CM | POA: Insufficient documentation

## 2022-03-14 NOTE — Progress Notes (Signed)
ROB [redacted]w[redacted]d  CC: None

## 2022-03-14 NOTE — Progress Notes (Signed)
   PRENATAL VISIT NOTE  Subjective:  Crystal Graves is a 36 y.o. [phone] at [redacted]w[redacted]d being seen today for ongoing prenatal care.  She is currently monitored for the following issues for this low-risk pregnancy and has Language barrier, speaks Spanish only; Supervision of other normal pregnancy, antepartum; and Request for sterilization on their problem list.  Patient reports no complaints.  Contractions: Not present. Vag. Bleeding: None.  Movement: Present. Denies leaking of fluid.   The following portions of the patient's history were reviewed and updated as appropriate: allergies, current medications, past family history, past medical history, past social history, past surgical history and problem list.   Objective:   Vitals:   03/14/22 1340  BP: 106/67  Pulse: 76  Weight: 142 lb (64.4 kg)    Fetal Status: Fetal Heart Rate (bpm): 156   Movement: Present     General:  Alert, oriented and cooperative. Patient is in no acute distress.  Skin: Skin is warm and dry. No rash noted.   Cardiovascular: Normal heart rate noted  Respiratory: Normal respiratory effort, no problems with respiration noted  Abdomen: Soft, gravid, appropriate for gestational age.  Pain/Pressure: Present     Pelvic: Cervical exam deferred        Extremities: Normal range of motion.  Edema: None  Mental Status: Normal mood and affect. Normal behavior. Normal judgment and thought content.   Assessment and Plan:  Pregnancy: G5P3013 at [redacted]w[redacted]d 1. [redacted] weeks gestation of pregnancy Routine care. Normal anatomy u/s today  2. Language barrier, speaks Spanish only Interpreter used  Preterm labor symptoms and general obstetric precautions including but not limited to vaginal bleeding, contractions, leaking of fluid and fetal movement were reviewed in detail with the patient. Please refer to After Visit Summary for other counseling recommendations.   No follow-ups on file.  Future Appointments  Date Time  Provider [place]  04/11/2022 11:15 AM Harolyn Rutherford, Sallyanne Havers, MD [facility]  06/06/2022  3:30 PM [facility] NURSE [facility]  06/06/2022  3:45 PM [facility] US4 [facility]    Aletha Halim, MD

## 2022-03-15 ENCOUNTER — Other Ambulatory Visit: Payer: Self-pay | Admitting: *Deleted

## 2022-03-15 DIAGNOSIS — O09523 Supervision of elderly multigravida, third trimester: Secondary | ICD-10-CM

## 2022-04-11 ENCOUNTER — Encounter: Payer: Self-pay | Admitting: Obstetrics & Gynecology

## 2022-04-11 ENCOUNTER — Ambulatory Visit (INDEPENDENT_AMBULATORY_CARE_PROVIDER_SITE_OTHER): Payer: BC Managed Care – PPO | Admitting: Obstetrics & Gynecology

## 2022-04-11 VITALS — BP 103/60 | HR 80 | Wt 143.0 lb

## 2022-04-11 DIAGNOSIS — Z3482 Encounter for supervision of other normal pregnancy, second trimester: Secondary | ICD-10-CM

## 2022-04-11 DIAGNOSIS — Z3480 Encounter for supervision of other normal pregnancy, unspecified trimester: Secondary | ICD-10-CM

## 2022-04-11 DIAGNOSIS — Z23 Encounter for immunization: Secondary | ICD-10-CM

## 2022-04-11 DIAGNOSIS — Z3A23 23 weeks gestation of pregnancy: Secondary | ICD-10-CM

## 2022-04-11 NOTE — Progress Notes (Signed)
   PRENATAL VISIT NOTE  Subjective:  Crystal Graves is a 36 y.o. [phone] at [redacted]w[redacted]d being seen today for ongoing prenatal care. Patient is Spanish-speaking only, interpreter present for this encounter.  She is currently monitored for the following issues for this low-risk pregnancy and has Language barrier, speaks Spanish only; Supervision of other normal pregnancy, antepartum; and Request for sterilization on their problem list.  Patient reports no complaints.  Contractions: Not present. Vag. Bleeding: None.  Movement: Present. Denies leaking of fluid.   The following portions of the patient's history were reviewed and updated as appropriate: allergies, current medications, past family history, past medical history, past social history, past surgical history and problem list.   Objective:   Vitals:   04/11/22 1114  BP: 103/60  Pulse: 80  Weight: 143 lb (64.9 kg)    Fetal Status:   Fundal Height: 23 cm Movement: Present     General:  Alert, oriented and cooperative. Patient is in no acute distress.  Skin: Skin is warm and dry. No rash noted.   Cardiovascular: Normal heart rate noted  Respiratory: Normal respiratory effort, no problems with respiration noted  Abdomen: Soft, gravid, appropriate for gestational age.  Pain/Pressure: Absent     Pelvic: Cervical exam deferred        Extremities: Normal range of motion.  Edema: None  Mental Status: Normal mood and affect. Normal behavior. Normal judgment and thought content.   Assessment and Plan:  Pregnancy: G5P3013 at [redacted]w[redacted]d 1. Flu vaccine need - Flu Vaccine QUAD 36+ mos IM (Fluarix, Quad PF) given today  2. [redacted] weeks gestation of pregnancy 3. Supervision of other normal pregnancy, antepartum Discussed 2 hr GTT recommendations for next visit, she only wants to do 1 hr GTT.  Advised that abnormal 1 hr GTT will need to be followed up by confirmatory 2 or 3 hr GTT.  She is okay with this.  Future orders placed.  - CBC; Future -  RPR; Future - HIV Antibody (routine testing w rflx); Future - Glucose tolerance, 1 hour; Future Preterm labor symptoms and general obstetric precautions including but not limited to vaginal bleeding, contractions, leaking of fluid and fetal movement were reviewed in detail with the patient. Please refer to After Visit Summary for other counseling recommendations.   Return in about 4 weeks (around 05/09/2022) for 1 hr GTT, 3rd trimester labs, TDap, OFFICE OB VISIT (MD or APP).  Future Appointments  Date Time Provider [place]  05/31/2022  8:20 AM [facility] LAB [facility]  05/31/2022  9:35 AM Donnamae Jude, MD [facility]  06/06/2022  3:30 PM [facility] NURSE [facility]  06/06/2022  3:45 PM [facility] US4 [facility]    Verita Schneiders, MD

## 2022-05-10 ENCOUNTER — Telehealth: Payer: BC Managed Care – PPO | Admitting: Physician Assistant

## 2022-05-10 DIAGNOSIS — J069 Acute upper respiratory infection, unspecified: Secondary | ICD-10-CM

## 2022-05-10 MED ORDER — FLUTICASONE PROPIONATE 50 MCG/ACT NA SUSP: 2.0000 | Freq: Every day | NASAL | 0 refills | Status: DC

## 2022-05-10 NOTE — Progress Notes (Signed)
E-Visit for Upper Respiratory Infection   We are sorry you are not feeling well.  Here is how we plan to help!  Based on what you have shared with me, it looks like you may have a viral upper respiratory infection.  Upper respiratory infections are caused by a large number of viruses; however, rhinovirus is the most common cause.   Symptoms vary from person to person, with common symptoms including sore throat, cough, fatigue or lack of energy and feeling of general discomfort.  A low-grade fever of up to 100.4 may present, but is often uncommon.  Symptoms vary however, and are closely related to a person's age or underlying illnesses.  The most common symptoms associated with an upper respiratory infection are nasal discharge or congestion, cough, sneezing, headache and pressure in the ears and face.  These symptoms usually persist for about 3 to 10 days, but can last up to 2 weeks.  It is important to know that upper respiratory infections do not cause serious illness or complications in most cases.    Upper respiratory infections can be transmitted from person to person, with the most common method of transmission being a person's hands.  The virus is able to live on the skin and can infect other persons for up to 2 hours after direct contact.  Also, these can be transmitted when someone coughs or sneezes; thus, it is important to cover the mouth to reduce this risk.  To keep the spread of the illness at bay, good hand hygiene is very important.  This is an infection that is most likely caused by a virus. There are no specific treatments other than to help you with the symptoms until the infection runs its course.  We are sorry you are not feeling well.  Here is how we plan to help!   For nasal congestion, you may use an oral decongestants such as Mucinex D or if you have glaucoma or high blood pressure use plain Mucinex.  Saline nasal spray or nasal drops can help and can safely be used as often as  needed for congestion.  For your congestion, I have prescribed Fluticasone nasal spray one spray in each nostril twice a day. This will help nasal congestion, post nasal drainage, and ear pain/congestion.   If you do not have a history of heart disease, hypertension, diabetes or thyroid disease, prostate/bladder issues or glaucoma, you may also use Sudafed to treat nasal congestion.  It is highly recommended that you consult with a pharmacist or your primary care physician to ensure this medication is safe for you to take.     If you have a cough, you may use cough suppressants such as Delsym and Robitussin.  If you have glaucoma or high blood pressure, you can also use Coricidin HBP.   Safest cough medications in pregnancy are the Over-the-counter cough medications.  If you have a sore or scratchy throat, use a saltwater gargle-  to  teaspoon of salt dissolved in a 4-ounce to 8-ounce glass of warm water.  Gargle the solution for approximately 15-30 seconds and then spit.  It is important not to swallow the solution.  You can also use throat lozenges/cough drops and Chloraseptic spray to help with throat pain or discomfort.  Warm or cold liquids can also be helpful in relieving throat pain.  For headache, pain or general discomfort, you can use Ibuprofen or Tylenol as directed.   Some authorities believe that zinc sprays or the use  of Echinacea may shorten the course of your symptoms.   HOME CARE Only take medications as instructed by your medical team. Be sure to drink plenty of fluids. Water is fine as well as fruit juices, sodas and electrolyte beverages. You may want to stay away from caffeine or alcohol. If you are nauseated, try taking small sips of liquids. How do you know if you are getting enough fluid? Your urine should be a pale yellow or almost colorless. Get rest. Taking a steamy shower or using a humidifier may help nasal congestion and ease sore throat pain. You can place a towel  over your head and breathe in the steam from hot water coming from a faucet. Using a saline nasal spray works much the same way. Cough drops, hard candies and sore throat lozenges may ease your cough. Avoid close contacts especially the very young and the elderly Cover your mouth if you cough or sneeze Always remember to wash your hands.   GET HELP RIGHT AWAY IF: You develop worsening fever. If your symptoms do not improve within 10 days You develop yellow or green discharge from your nose over 3 days. You have coughing fits You develop a severe head ache or visual changes. You develop shortness of breath, difficulty breathing or start having chest pain Your symptoms persist after you have completed your treatment plan  MAKE SURE YOU  Understand these instructions. Will watch your condition. Will get help right away if you are not doing well or get worse.  Thank you for choosing an e-visit.  Your e-visit answers were reviewed by a board certified advanced clinical practitioner to complete your personal care plan. Depending upon the condition, your plan could have included both over the counter or prescription medications.  Please review your pharmacy choice. Make sure the pharmacy is open so you can pick up prescription now. If there is a problem, you may contact your provider through [employer] and have the prescription routed to another pharmacy.  Your safety is important to [place]. If you have drug allergies check your prescription carefully.   For the next 24 hours you can use MyChart to ask questions about today's visit, request a non-urgent call back, or ask for a work or school excuse. You will get an email in the next two days asking about your experience. I hope that your e-visit has been valuable and will speed your recovery.   I have spent 5 minutes in review of e-visit questionnaire, review and updating patient chart, medical decision making and response to patient.    Mar Daring, PA-C

## 2022-05-25 ENCOUNTER — Other Ambulatory Visit: Payer: BC Managed Care – PPO

## 2022-05-25 ENCOUNTER — Ambulatory Visit (INDEPENDENT_AMBULATORY_CARE_PROVIDER_SITE_OTHER): Payer: BC Managed Care – PPO | Admitting: Obstetrics and Gynecology

## 2022-05-25 VITALS — BP 111/72 | HR 73 | Wt 146.0 lb

## 2022-05-25 DIAGNOSIS — Z23 Encounter for immunization: Secondary | ICD-10-CM

## 2022-05-25 DIAGNOSIS — Z3483 Encounter for supervision of other normal pregnancy, third trimester: Secondary | ICD-10-CM

## 2022-05-25 DIAGNOSIS — Z3480 Encounter for supervision of other normal pregnancy, unspecified trimester: Secondary | ICD-10-CM

## 2022-05-25 DIAGNOSIS — O09523 Supervision of elderly multigravida, third trimester: Secondary | ICD-10-CM | POA: Insufficient documentation

## 2022-05-25 DIAGNOSIS — Z3A29 29 weeks gestation of pregnancy: Secondary | ICD-10-CM | POA: Diagnosis not present

## 2022-05-25 DIAGNOSIS — Z789 Other specified health status: Secondary | ICD-10-CM

## 2022-05-25 NOTE — Progress Notes (Signed)
   PRENATAL VISIT NOTE  Subjective:  Crystal Graves is a 36 y.o. [phone] at [redacted]w[redacted]d being seen today for ongoing prenatal care.  She is currently monitored for the following issues for this low-risk pregnancy and has Language barrier, speaks Spanish only; Supervision of other normal pregnancy, antepartum; Request for sterilization; and Multigravida of advanced maternal age in third trimester on their problem list.  Patient reports no complaints.  Contractions: Not present. Vag. Bleeding: None.  Movement: Present. Denies leaking of fluid.   The following portions of the patient's history were reviewed and updated as appropriate: allergies, current medications, past family history, past medical history, past social history, past surgical history and problem list.   Objective:   Vitals:   05/25/22 0836  BP: 111/72  Pulse: 73  Weight: 146 lb (66.2 kg)    Fetal Status: Fetal Heart Rate (bpm): 145 Fundal Height: 29 cm Movement: Present     General:  Alert, oriented and cooperative. Patient is in no acute distress.  Skin: Skin is warm and dry. No rash noted.   Cardiovascular: Normal heart rate noted  Respiratory: Normal respiratory effort, no problems with respiration noted  Abdomen: Soft, gravid, appropriate for gestational age.  Pain/Pressure: Present     Pelvic: Cervical exam deferred        Extremities: Normal range of motion.  Edema: Trace  Mental Status: Normal mood and affect. Normal behavior. Normal judgment and thought content.   Assessment and Plan:  Pregnancy: [id number] at [redacted]w[redacted]d 1. Supervision of other normal pregnancy, antepartum 28wk labs and tdap today.   2. Language barrier, speaks Spanish only In person interpreter used  3. Multigravida of advanced maternal age in third trimester Surveillance growth u/s early next month  Preterm labor symptoms and general obstetric precautions including but not limited to vaginal bleeding, contractions, leaking of fluid and  fetal movement were reviewed in detail with the patient. Please refer to After Visit Summary for other counseling recommendations.   No follow-ups on file.  Future Appointments  Date Time Provider Department Center  06/06/2022  3:30 PM [facility] NURSE [facility]  06/06/2022  3:45 PM [facility] US4 [facility]  06/08/2022  1:30 PM [place] Bing, MD [facility]    Dunbar Bing, MD

## 2022-05-26 LAB — CBC
Hematocrit: 35.2 % (ref 34.0–46.6)
Hemoglobin: 11.9 g/dL (ref 11.1–15.9)
MCH: 30.6 pg (ref 26.6–33.0)
MCHC: 33.8 g/dL (ref 31.5–35.7)
MCV: 91 fL (ref 79–97)
Platelets: 309 10*3/uL (ref 150–450)
RBC: 3.89 x10E6/uL (ref 3.77–5.28)
RDW: 11.9 % (ref 11.7–15.4)
WBC: 6.8 10*3/uL (ref 3.4–10.8)

## 2022-05-26 LAB — RPR: RPR Ser Ql: NONREACTIVE

## 2022-05-26 LAB — GLUCOSE TOLERANCE, 1 HOUR: Glucose, 1Hr PP: 121 mg/dL (ref 70–199)

## 2022-05-26 LAB — HIV ANTIBODY (ROUTINE TESTING W REFLEX): HIV Screen 4th Generation wRfx: NONREACTIVE

## 2022-05-31 ENCOUNTER — Encounter: Payer: BC Managed Care – PPO | Admitting: Family Medicine

## 2022-05-31 ENCOUNTER — Other Ambulatory Visit: Payer: BC Managed Care – PPO

## 2022-06-05 NOTE — L&D Delivery Note (Signed)
Delivery Note ROM x 11 minutes. Pushed x 3 contractions. At 7:23 PM a viable, healthy, and vigorous  female was delivered via Vaginal, Spontaneous (Presentation:  Occiput Anterior). Shoulders delivered easily. Infant placed skin-to-skin w/ mom. Delayed cord clamping x 1 minute. Cord clamped x 2 and cut by FOB. Pitocin bolus started immediately after delivery of baby. TXA given for hemorrhage prophylaxis due to multiparity. APGAR: 9, 9; weight 6 lb 6.3 oz (2900 g).   Placenta status: Spontaneous, retained, adherent. See details below. Cord: 3 vessels with the following complications: loose nuchal x 1.    CNM unable to deliver placenta with gentle cord traction and maternal pushing efforts by 20 minutes post-delivery. VSS. Interpreter called to BS. ~1945: Pitocin bolus stopped. 1955: CNM still unable to deliver placenta with gentle cord traction. Attempted to manually remove placenta but pt did not tolerate. CNM unable reach further than cervix or to palpate placenta.  1957: Fentanyl 100 mcg given.  1959: CNM attempted manual removal of placenta. Placenta fundal, able to reach edge but then unable to cleave placenta from wall of uterus and unable to reach more than an inch high than low uterine segment.  2001: Pitocin 1 U injected into cord Called Dr. Elonda Husky, attending and Dilaudid ordered. VSS. QBL 150 cc ~2009: Dr. Elonda Husky at [place] Dilaudid 2 mg given. Attempted manual removal of placenta, but not able. Called Anesthesia to administer Nitroglycerine to relax uterus. Additional mg Dilaudid given. Still unable to deliver placenta. Recommends removal in OR. Pt agrees. Will perform BTL while in OR. Prep for OR.   Anesthesia: None Episiotomy: None Lacerations: None  Suture Repair:  NA Est. Blood Loss (mL): 400 cc  Mom to OR.  Baby to Couplet care / Skin to Skin.  Manya Silvas 07/16/2022, 8:50 PM

## 2022-06-06 ENCOUNTER — Ambulatory Visit: Payer: BC Managed Care – PPO | Admitting: *Deleted

## 2022-06-06 ENCOUNTER — Ambulatory Visit: Payer: BC Managed Care – PPO | Attending: Obstetrics and Gynecology

## 2022-06-06 ENCOUNTER — Encounter: Payer: Self-pay | Admitting: *Deleted

## 2022-06-06 VITALS — BP 98/59 | HR 93

## 2022-06-06 DIAGNOSIS — O3663X0 Maternal care for excessive fetal growth, third trimester, not applicable or unspecified: Secondary | ICD-10-CM | POA: Insufficient documentation

## 2022-06-06 DIAGNOSIS — Z3A31 31 weeks gestation of pregnancy: Secondary | ICD-10-CM | POA: Diagnosis not present

## 2022-06-06 DIAGNOSIS — Z3480 Encounter for supervision of other normal pregnancy, unspecified trimester: Secondary | ICD-10-CM | POA: Insufficient documentation

## 2022-06-06 DIAGNOSIS — O3443 Maternal care for other abnormalities of cervix, third trimester: Secondary | ICD-10-CM

## 2022-06-06 DIAGNOSIS — O09523 Supervision of elderly multigravida, third trimester: Secondary | ICD-10-CM | POA: Diagnosis not present

## 2022-06-07 ENCOUNTER — Other Ambulatory Visit: Payer: Self-pay | Admitting: *Deleted

## 2022-06-07 DIAGNOSIS — O09523 Supervision of elderly multigravida, third trimester: Secondary | ICD-10-CM

## 2022-06-07 DIAGNOSIS — O3663X1 Maternal care for excessive fetal growth, third trimester, fetus 1: Secondary | ICD-10-CM

## 2022-06-08 ENCOUNTER — Ambulatory Visit (INDEPENDENT_AMBULATORY_CARE_PROVIDER_SITE_OTHER): Payer: BC Managed Care – PPO | Admitting: Obstetrics and Gynecology

## 2022-06-08 VITALS — BP 114/72 | HR 83 | Wt 147.0 lb

## 2022-06-08 DIAGNOSIS — Z789 Other specified health status: Secondary | ICD-10-CM

## 2022-06-08 DIAGNOSIS — Z3A31 31 weeks gestation of pregnancy: Secondary | ICD-10-CM

## 2022-06-08 DIAGNOSIS — O3663X0 Maternal care for excessive fetal growth, third trimester, not applicable or unspecified: Secondary | ICD-10-CM

## 2022-06-08 DIAGNOSIS — Z302 Encounter for sterilization: Secondary | ICD-10-CM

## 2022-06-08 DIAGNOSIS — O09523 Supervision of elderly multigravida, third trimester: Secondary | ICD-10-CM

## 2022-06-08 NOTE — Progress Notes (Addendum)
   PRENATAL VISIT NOTE  Subjective:  Crystal Graves is a 37 y.o. [phone] at [redacted]w[redacted]d being seen today for ongoing prenatal care.  She is currently monitored for the following issues for this low-risk pregnancy and has Language barrier, speaks Spanish only; Supervision of other normal pregnancy, antepartum; Request for sterilization; Multigravida of advanced maternal age in third trimester; and Excessive fetal growth affecting management of mother in third trimester, antepartum on their problem list.  Patient reports no complaints.  Contractions: Not present. Vag. Bleeding: None.  Movement: Present. Denies leaking of fluid.   The following portions of the patient's history were reviewed and updated as appropriate: allergies, current medications, past family history, past medical history, past social history, past surgical history and problem list.   Objective:   Vitals:   06/08/22 1338  BP: 114/72  Pulse: 83  Weight: 147 lb (66.7 kg)    Fetal Status: Fetal Heart Rate (bpm): 162   Movement: Present     General:  Alert, oriented and cooperative. Patient is in no acute distress.  Skin: Skin is warm and dry. No rash noted.   Cardiovascular: Normal heart rate noted  Respiratory: Normal respiratory effort, no problems with respiration noted  Abdomen: Soft, gravid, appropriate for gestational age.  Pain/Pressure: Absent     Pelvic: Cervical exam deferred        Extremities: Normal range of motion.     Mental Status: Normal mood and affect. Normal behavior. Normal judgment and thought content.   Assessment and Plan:  Pregnancy: [id number] at [redacted]w[redacted]d 1. Excessive fetal growth affecting management of pregnancy in third trimester, single or unspecified fetus 1/2: 98%, 2238gm, ac 92%, afi 15. Largest prior, per patient, was a little over 9lbs in 2012 (no GDM that pregnancy). She has passed her 1h GTT this pregnancy  2. Multigravida of advanced maternal age in third trimester No issues  3.  Language barrier, speaks Spanish only In person interpreter used  4. Request for sterilization Patient states she has BCBS, so not an adopt a mom patient as previously noted. Pt to bring insurance card to scan to verify coverage. PPBTL r/b/a d/w her. Message sent to [facility] to see if will cover a BTL  Preterm labor symptoms and general obstetric precautions including but not limited to vaginal bleeding, contractions, leaking of fluid and fetal movement were reviewed in detail with the patient. Please refer to After Visit Summary for other counseling recommendations.   No follow-ups on file.  Future Appointments  Date Time Provider [place]  07/11/2022  7:45 AM [facility] NURSE [facility]  07/11/2022  8:00 AM [facility] US1 [facility]    Aletha Halim, MD

## 2022-06-13 ENCOUNTER — Telehealth: Payer: Self-pay

## 2022-06-13 NOTE — Telephone Encounter (Signed)
Notified pt via phone call about her BTL and insurance coverage. Pt understood

## 2022-06-22 ENCOUNTER — Ambulatory Visit (INDEPENDENT_AMBULATORY_CARE_PROVIDER_SITE_OTHER): Payer: BC Managed Care – PPO | Admitting: Obstetrics and Gynecology

## 2022-06-22 VITALS — BP 99/62 | HR 78 | Wt 147.0 lb

## 2022-06-22 DIAGNOSIS — O3663X0 Maternal care for excessive fetal growth, third trimester, not applicable or unspecified: Secondary | ICD-10-CM

## 2022-06-22 DIAGNOSIS — Z302 Encounter for sterilization: Secondary | ICD-10-CM

## 2022-06-22 DIAGNOSIS — Z789 Other specified health status: Secondary | ICD-10-CM

## 2022-06-22 DIAGNOSIS — O09523 Supervision of elderly multigravida, third trimester: Secondary | ICD-10-CM

## 2022-06-22 DIAGNOSIS — Z3480 Encounter for supervision of other normal pregnancy, unspecified trimester: Secondary | ICD-10-CM

## 2022-06-22 DIAGNOSIS — Z3A33 33 weeks gestation of pregnancy: Secondary | ICD-10-CM

## 2022-06-22 NOTE — Progress Notes (Signed)
   PRENATAL VISIT NOTE  Subjective:  Crystal Graves is a 37 y.o. [phone] at [redacted]w[redacted]d being seen today for ongoing prenatal care.  She is currently monitored for the following issues for this low-risk pregnancy and has Language barrier, speaks Spanish only; Supervision of other normal pregnancy, antepartum; Request for sterilization; Multigravida of advanced maternal age in third trimester; and Excessive fetal growth affecting management of mother in third trimester, antepartum on their problem list.  Patient reports no complaints.  Contractions: Irritability. Vag. Bleeding: None.  Movement: Present. Denies leaking of fluid.   The following portions of the patient's history were reviewed and updated as appropriate: allergies, current medications, past family history, past medical history, past social history, past surgical history and problem list.   Objective:   Vitals:   06/22/22 1555  BP: 99/62  Pulse: 78  Weight: 147 lb (66.7 kg)    Fetal Status: Fetal Heart Rate (bpm): 138   Movement: Present     General:  Alert, oriented and cooperative. Patient is in no acute distress.  Skin: Skin is warm and dry. No rash noted.   Cardiovascular: Normal heart rate noted  Respiratory: Normal respiratory effort, no problems with respiration noted  Abdomen: Soft, gravid, appropriate for gestational age.  Pain/Pressure: Present     Pelvic: Cervical exam deferred        Extremities: Normal range of motion.  Edema: None  Mental Status: Normal mood and affect. Normal behavior. Normal judgment and thought content.   Assessment and Plan:  Pregnancy: [id number] at [redacted]w[redacted]d 1. Supervision of other normal pregnancy, antepartum Routine care. D/w her re: BTL is okay with her insurance  2. [redacted] weeks gestation of pregnancy  3. Language barrier, speaks Spanish only In person interpreter  4. Excessive fetal growth affecting management of pregnancy in third trimester, single or unspecified fetus D/w her  that may recommend 39wk delivery based on if still LGA but will decide in a few weeks if to make that recommendation 1/2: 98%, 2238gm, ac 92%, afi 15. Largest prior, per patient, was a little over 9lbs in 2012 (no GDM that pregnancy). She has passed her 1h GTT this pregnancy   5. Multigravida of advanced maternal age in third trimester No issues  6. Request for sterilization   Preterm labor symptoms and general obstetric precautions including but not limited to vaginal bleeding, contractions, leaking of fluid and fetal movement were reviewed in detail with the patient. Please refer to After Visit Summary for other counseling recommendations.   No follow-ups on file.  Future Appointments  Date Time Provider [place]  07/06/2022  1:30 PM Aletha Halim, MD [facility]  07/11/2022  7:45 AM [facility] NURSE [facility]  07/11/2022  8:00 AM [facility] US1 [facility]  07/13/2022  1:30 PM Darlina Rumpf, CNM [facility]  07/20/2022 10:35 AM Aletha Halim, MD [facility]  07/27/2022  1:30 PM Darlina Rumpf, CNM [facility]  08/03/2022  1:50 PM Aletha Halim, MD [facility]    Aletha Halim, MD

## 2022-06-22 NOTE — Progress Notes (Signed)
ROB [redacted]w[redacted]d  CC: None    

## 2022-07-06 ENCOUNTER — Ambulatory Visit (INDEPENDENT_AMBULATORY_CARE_PROVIDER_SITE_OTHER): Payer: BC Managed Care – PPO | Admitting: Obstetrics and Gynecology

## 2022-07-06 ENCOUNTER — Other Ambulatory Visit: Payer: Self-pay | Admitting: Physician Assistant

## 2022-07-06 VITALS — BP 99/66 | HR 86 | Wt 147.0 lb

## 2022-07-06 DIAGNOSIS — O09523 Supervision of elderly multigravida, third trimester: Secondary | ICD-10-CM

## 2022-07-06 DIAGNOSIS — Z3A35 35 weeks gestation of pregnancy: Secondary | ICD-10-CM | POA: Diagnosis not present

## 2022-07-06 DIAGNOSIS — J069 Acute upper respiratory infection, unspecified: Secondary | ICD-10-CM

## 2022-07-06 DIAGNOSIS — O3663X1 Maternal care for excessive fetal growth, third trimester, fetus 1: Secondary | ICD-10-CM

## 2022-07-06 DIAGNOSIS — O3663X0 Maternal care for excessive fetal growth, third trimester, not applicable or unspecified: Secondary | ICD-10-CM

## 2022-07-06 DIAGNOSIS — Z789 Other specified health status: Secondary | ICD-10-CM

## 2022-07-06 NOTE — Progress Notes (Signed)
ROB [redacted]w[redacted]d  CC: None

## 2022-07-06 NOTE — Progress Notes (Signed)
   PRENATAL VISIT NOTE  Subjective:  Crystal Graves is a 37 y.o. [phone] at [redacted]w[redacted]d being seen today for ongoing prenatal care.  She is currently monitored for the following issues for this low-risk pregnancy and has Language barrier, speaks Spanish only; Supervision of other normal pregnancy, antepartum; Request for sterilization; Multigravida of advanced maternal age in third trimester; and Excessive fetal growth affecting management of mother in third trimester, antepartum on their problem list.  Patient reports no complaints.  Contractions: Not present. Vag. Bleeding: None.  Movement: Present. Denies leaking of fluid.   The following portions of the patient's history were reviewed and updated as appropriate: allergies, current medications, past family history, past medical history, past social history, past surgical history and problem list.   Objective:   Vitals:   07/06/22 1334  BP: 99/66  Pulse: 86  Weight: 147 lb (66.7 kg)    Fetal Status: Fetal Heart Rate (bpm): 136 Fundal Height: 35 cm Movement: Present  Presentation: Vertex  General:  Alert, oriented and cooperative. Patient is in no acute distress.  Skin: Skin is warm and dry. No rash noted.   Cardiovascular: Normal heart rate noted  Respiratory: Normal respiratory effort, no problems with respiration noted  Abdomen: Soft, gravid, appropriate for gestational age.  Pain/Pressure: Absent     Pelvic: Cervical exam deferred        Extremities: Normal range of motion.  Edema: None  Mental Status: Normal mood and affect. Normal behavior. Normal judgment and thought content.   Assessment and Plan:  Pregnancy: G5P3013 at [redacted]w[redacted]d 1. [redacted] weeks gestation of pregnancy GBS next visit BTL  2. Excessive fetal growth affecting management of pregnancy in third trimester, single or unspecified fetus F/u growth u/s. Delivery timing based on that  3. Multigravida of advanced maternal age in third trimester See above  Delivery  by [facility] 4. Language barrier, speaks Spanish only Interpreter used  Preterm labor symptoms and general obstetric precautions including but not limited to vaginal bleeding, contractions, leaking of fluid and fetal movement were reviewed in detail with the patient. Please refer to After Visit Summary for other counseling recommendations.   Return in about 1 week (around 07/13/2022) for low risk ob, in person, md or app.  Future Appointments  Date Time Provider [place]  07/11/2022  7:45 AM [facility] NURSE [facility]  07/11/2022  8:00 AM [facility] US1 [facility]  07/13/2022  1:30 PM Kieth Brightly [facility]  07/20/2022 10:35 AM Aletha Halim, MD [facility]  07/27/2022  1:30 PM Darlina Rumpf, CNM [facility]  08/03/2022  1:50 PM Aletha Halim, MD [facility]    Aletha Halim, MD

## 2022-07-11 ENCOUNTER — Encounter: Payer: Self-pay | Admitting: *Deleted

## 2022-07-11 ENCOUNTER — Ambulatory Visit: Payer: BC Managed Care – PPO | Attending: Obstetrics and Gynecology

## 2022-07-11 ENCOUNTER — Ambulatory Visit: Payer: BC Managed Care – PPO | Admitting: *Deleted

## 2022-07-11 VITALS — BP 113/72 | HR 76

## 2022-07-11 DIAGNOSIS — O09523 Supervision of elderly multigravida, third trimester: Secondary | ICD-10-CM | POA: Insufficient documentation

## 2022-07-11 DIAGNOSIS — Z3480 Encounter for supervision of other normal pregnancy, unspecified trimester: Secondary | ICD-10-CM | POA: Insufficient documentation

## 2022-07-11 DIAGNOSIS — Z3A36 36 weeks gestation of pregnancy: Secondary | ICD-10-CM | POA: Diagnosis not present

## 2022-07-11 DIAGNOSIS — O3663X0 Maternal care for excessive fetal growth, third trimester, not applicable or unspecified: Secondary | ICD-10-CM | POA: Insufficient documentation

## 2022-07-11 DIAGNOSIS — O3663X1 Maternal care for excessive fetal growth, third trimester, fetus 1: Secondary | ICD-10-CM

## 2022-07-11 DIAGNOSIS — Z87410 Personal history of cervical dysplasia: Secondary | ICD-10-CM | POA: Diagnosis not present

## 2022-07-13 ENCOUNTER — Encounter: Payer: Self-pay | Admitting: Advanced Practice Midwife

## 2022-07-13 ENCOUNTER — Ambulatory Visit (INDEPENDENT_AMBULATORY_CARE_PROVIDER_SITE_OTHER): Payer: BC Managed Care – PPO | Admitting: Advanced Practice Midwife

## 2022-07-13 ENCOUNTER — Other Ambulatory Visit (HOSPITAL_COMMUNITY)
Admission: RE | Admit: 2022-07-13 | Discharge: 2022-07-13 | Disposition: A | Discharge status: Home / Self Care | Payer: BC Managed Care – PPO | Source: Ambulatory Visit | Attending: Advanced Practice Midwife | Admitting: Advanced Practice Midwife

## 2022-07-13 VITALS — BP 124/78 | HR 79 | Wt 148.0 lb

## 2022-07-13 DIAGNOSIS — Z3480 Encounter for supervision of other normal pregnancy, unspecified trimester: Secondary | ICD-10-CM

## 2022-07-13 DIAGNOSIS — O3663X0 Maternal care for excessive fetal growth, third trimester, not applicable or unspecified: Secondary | ICD-10-CM

## 2022-07-13 DIAGNOSIS — Z302 Encounter for sterilization: Secondary | ICD-10-CM

## 2022-07-13 DIAGNOSIS — Z3A36 36 weeks gestation of pregnancy: Secondary | ICD-10-CM

## 2022-07-13 DIAGNOSIS — O09523 Supervision of elderly multigravida, third trimester: Secondary | ICD-10-CM

## 2022-07-13 DIAGNOSIS — Z789 Other specified health status: Secondary | ICD-10-CM

## 2022-07-13 NOTE — Progress Notes (Signed)
ROB [redacted]w[redacted]d  GBS today.   Pt would like cervix check today.   Pt dropped off STD paperwork.

## 2022-07-13 NOTE — Progress Notes (Signed)
   PRENATAL VISIT NOTE  Subjective:  Crystal Graves is a 37 y.o. [phone] at [redacted]w[redacted]d being seen today for ongoing prenatal care.  She is currently monitored for the following issues for this low-risk pregnancy and has Language barrier, speaks Spanish only; Supervision of other normal pregnancy, antepartum; Request for sterilization; Multigravida of advanced maternal age in third trimester; and Excessive fetal growth affecting management of mother in third trimester, antepartum on their problem list.  Patient reports no complaints. Vag. Bleeding: None.  Movement: Present. Denies leaking of fluid.   The following portions of the patient's history were reviewed and updated as appropriate: allergies, current medications, past family history, past medical history, past social history, past surgical history and problem list. Problem list updated.  Objective:   Vitals:   07/13/22 1334  BP: 124/78  Pulse: 79  Weight: 148 lb (67.1 kg)    Fetal Status: Fetal Heart Rate (bpm): 150 Fundal Height: 36 cm Movement: Present     General:  Alert, oriented and cooperative. Patient is in no acute distress.  Skin: Skin is warm and dry. No rash noted.   Cardiovascular: Normal heart rate noted  Respiratory: Normal respiratory effort, no problems with respiration noted  Abdomen: Soft, gravid, appropriate for gestational age.  Pain/Pressure: Present     Pelvic: Cervical exam performed per patient request Dilation: 1.5 Effacement (%): 50 Station: -3 Vertex by suture  Extremities: Normal range of motion.  Edema: Trace  Mental Status: Normal mood and affect. Normal behavior. Normal judgment and thought content.   Assessment and Plan:  Pregnancy: [id number] at [redacted]w[redacted]d  1. Supervision of other normal pregnancy, antepartum - Routine care - All previous births s/p spontaneous labor per patient  2. Multigravida of advanced maternal age in third trimester   3. Excessive fetal growth affecting management of  pregnancy in third trimester, single or unspecified fetus - EFW 78% as of 07/11/2022 at 36w 1d - Proven pelvis to 9 lbs (2012)  4. Request for sterilization - BCBS  5. Language barrier, speaks Spanish only - In-person interpreter present for all patient interaction  6. [redacted] weeks gestation of pregnancy  - Strep Gp B NAA - Cervicovaginal ancillary only  Preterm labor symptoms and general obstetric precautions including but not limited to vaginal bleeding, contractions, leaking of fluid and fetal movement were reviewed in detail with the patient. Please refer to After Visit Summary for other counseling recommendations.  Return in about 1 week (around 07/20/2022) for MD or CNM.  Future Appointments  Date Time Provider [place]  07/20/2022 10:35 AM Aletha Halim, MD [facility]  07/27/2022  1:30 PM Darlina Rumpf, CNM [facility]  08/03/2022  1:50 PM Aletha Halim, MD [facility]    Darlina Rumpf, CNM

## 2022-07-14 LAB — CERVICOVAGINAL ANCILLARY ONLY
Chlamydia: NEGATIVE
Comment: NEGATIVE
Comment: NORMAL
Neisseria Gonorrhea: NEGATIVE

## 2022-07-15 LAB — STREP GP B NAA: Strep Gp B NAA: NEGATIVE

## 2022-07-16 ENCOUNTER — Inpatient Hospital Stay (HOSPITAL_COMMUNITY): Payer: BC Managed Care – PPO | Admitting: Anesthesiology

## 2022-07-16 ENCOUNTER — Inpatient Hospital Stay (HOSPITAL_COMMUNITY)
Admission: AD | Admit: 2022-07-16 | Discharge: 2022-07-18 | DRG: 798 | Disposition: A | Discharge status: Home / Self Care | Payer: BC Managed Care – PPO | Attending: Obstetrics & Gynecology | Admitting: Obstetrics & Gynecology

## 2022-07-16 ENCOUNTER — Encounter (HOSPITAL_COMMUNITY)
Admission: AD | Disposition: A | Discharge status: Home / Self Care | Payer: Self-pay | Source: Home / Self Care | Attending: Obstetrics & Gynecology

## 2022-07-16 ENCOUNTER — Encounter (HOSPITAL_COMMUNITY): Payer: Self-pay | Admitting: Obstetrics & Gynecology

## 2022-07-16 DIAGNOSIS — O9902 Anemia complicating childbirth: Secondary | ICD-10-CM | POA: Diagnosis present

## 2022-07-16 DIAGNOSIS — Z3A37 37 weeks gestation of pregnancy: Secondary | ICD-10-CM

## 2022-07-16 DIAGNOSIS — Z302 Encounter for sterilization: Secondary | ICD-10-CM | POA: Diagnosis not present

## 2022-07-16 DIAGNOSIS — Z3480 Encounter for supervision of other normal pregnancy, unspecified trimester: Principal | ICD-10-CM

## 2022-07-16 DIAGNOSIS — Z87410 Personal history of cervical dysplasia: Secondary | ICD-10-CM | POA: Diagnosis not present

## 2022-07-16 DIAGNOSIS — O26893 Other specified pregnancy related conditions, third trimester: Secondary | ICD-10-CM | POA: Diagnosis present

## 2022-07-16 HISTORY — PX: TUBAL LIGATION: SHX77

## 2022-07-16 LAB — CBC
HCT: 37.9 % (ref 36.0–46.0)
Hemoglobin: 12.4 g/dL (ref 12.0–15.0)
MCH: 29.5 pg (ref 26.0–34.0)
MCHC: 32.7 g/dL (ref 30.0–36.0)
MCV: 90.2 fL (ref 80.0–100.0)
Platelets: 294 10*3/uL (ref 150–400)
RBC: 4.2 MIL/uL (ref 3.87–5.11)
RDW: 12.7 % (ref 11.5–15.5)
WBC: 9.7 10*3/uL (ref 4.0–10.5)
nRBC: 0 % (ref 0.0–0.2)

## 2022-07-16 LAB — TYPE AND SCREEN
ABO/RH(D): AB POS
Antibody Screen: NEGATIVE

## 2022-07-16 SURGERY — LIGATION, FALLOPIAN TUBE, BILATERAL
Anesthesia: General

## 2022-07-16 MED ORDER — FLEET ENEMA 7-19 GM/118ML RE ENEM: 1.0000 | ENEMA | RECTAL | Status: DC | PRN

## 2022-07-16 MED ORDER — OXYTOCIN 10 UNIT/ML IJ SOLN
INTRAMUSCULAR | Status: AC
  Filled 2022-07-16: qty 1

## 2022-07-16 MED ORDER — PHENYLEPHRINE HCL-NACL 20-0.9 MG/250ML-% IV SOLN
INTRAVENOUS | Status: DC | PRN
  Administered 2022-07-16: 60 ug/min via INTRAVENOUS

## 2022-07-16 MED ORDER — LACTATED RINGERS IV SOLN: INTRAVENOUS | Status: DC | PRN

## 2022-07-16 MED ORDER — OXYTOCIN-SODIUM CHLORIDE 30-0.9 UT/500ML-% IV SOLN
2.5000 [IU]/h | INTRAVENOUS | Status: DC
  Filled 2022-07-16: qty 500

## 2022-07-16 MED ORDER — OXYTOCIN 10 UNIT/ML IJ SOLN
INTRAMUSCULAR | Status: DC | PRN
  Administered 2022-07-16: 30 [IU] via INTRAMUSCULAR

## 2022-07-16 MED ORDER — MIDAZOLAM HCL 2 MG/2ML IJ SOLN
INTRAMUSCULAR | Status: AC
  Filled 2022-07-16: qty 2

## 2022-07-16 MED ORDER — STERILE WATER FOR IRRIGATION IR SOLN
Status: DC | PRN
  Administered 2022-07-16: 1

## 2022-07-16 MED ORDER — OXYCODONE-ACETAMINOPHEN 5-325 MG PO TABS: 1.0000 | ORAL_TABLET | ORAL | Status: DC | PRN

## 2022-07-16 MED ORDER — LACTATED RINGERS IV BOLUS
1000.0000 mL | Freq: Once | INTRAVENOUS | Status: AC
  Administered 2022-07-16: 1000 mL via INTRAVENOUS

## 2022-07-16 MED ORDER — SUCCINYLCHOLINE CHLORIDE 200 MG/10ML IV SOSY
PREFILLED_SYRINGE | INTRAVENOUS | Status: DC | PRN
  Administered 2022-07-16: 120 mg via INTRAVENOUS

## 2022-07-16 MED ORDER — FENTANYL CITRATE (PF) 100 MCG/2ML IJ SOLN
100.0000 ug | INTRAMUSCULAR | Status: DC | PRN
  Administered 2022-07-16: 100 ug via INTRAVENOUS
  Filled 2022-07-16: qty 2

## 2022-07-16 MED ORDER — LACTATED RINGERS IV SOLN: INTRAVENOUS | Status: DC

## 2022-07-16 MED ORDER — PHENYLEPHRINE 80 MCG/ML (10ML) SYRINGE FOR IV PUSH (FOR BLOOD PRESSURE SUPPORT)
PREFILLED_SYRINGE | INTRAVENOUS | Status: AC
  Filled 2022-07-16: qty 10

## 2022-07-16 MED ORDER — HYDROMORPHONE HCL 1 MG/ML IJ SOLN
INTRAMUSCULAR | Status: AC
  Filled 2022-07-16: qty 1

## 2022-07-16 MED ORDER — KETOROLAC TROMETHAMINE 30 MG/ML IJ SOLN: 30.0000 mg | Freq: Once | INTRAMUSCULAR | Status: AC | PRN

## 2022-07-16 MED ORDER — SOD CITRATE-CITRIC ACID 500-334 MG/5ML PO SOLN
30.0000 mL | ORAL | Status: DC | PRN
  Administered 2022-07-16: 30 mL via ORAL
  Filled 2022-07-16: qty 30

## 2022-07-16 MED ORDER — LIDOCAINE HCL (PF) 1 % IJ SOLN: 30.0000 mL | INTRAMUSCULAR | Status: DC | PRN

## 2022-07-16 MED ORDER — KETOROLAC TROMETHAMINE 30 MG/ML IJ SOLN
INTRAMUSCULAR | Status: DC | PRN
  Administered 2022-07-16: 30 mg via INTRAVENOUS

## 2022-07-16 MED ORDER — ACETAMINOPHEN 325 MG PO TABS: 650.0000 mg | ORAL_TABLET | ORAL | Status: DC | PRN

## 2022-07-16 MED ORDER — NALBUPHINE HCL 10 MG/ML IJ SOLN
20.0000 mg | Freq: Once | INTRAMUSCULAR | Status: AC
  Administered 2022-07-16: 20 mg via INTRAVENOUS
  Filled 2022-07-16: qty 2

## 2022-07-16 MED ORDER — HYDROMORPHONE HCL 1 MG/ML IJ SOLN: 1.0000 mg | Freq: Once | INTRAMUSCULAR | Status: DC

## 2022-07-16 MED ORDER — TRANEXAMIC ACID-NACL 1000-0.7 MG/100ML-% IV SOLN
1000.0000 mg | Freq: Once | INTRAVENOUS | Status: AC
  Administered 2022-07-16: 1000 mg via INTRAVENOUS

## 2022-07-16 MED ORDER — SUGAMMADEX SODIUM 200 MG/2ML IV SOLN
INTRAVENOUS | Status: DC | PRN
  Administered 2022-07-16: 200 mg via INTRAVENOUS

## 2022-07-16 MED ORDER — OXYTOCIN 10 UNIT/ML IJ SOLN
10.0000 [IU] | Freq: Once | INTRAMUSCULAR | Status: AC
  Administered 2022-07-16: 10 [IU]

## 2022-07-16 MED ORDER — ROCURONIUM BROMIDE 100 MG/10ML IV SOLN
INTRAVENOUS | Status: DC | PRN
  Administered 2022-07-16: 20 mg via INTRAVENOUS

## 2022-07-16 MED ORDER — ALBUMIN HUMAN 5 % IV SOLN: INTRAVENOUS | Status: DC | PRN

## 2022-07-16 MED ORDER — TRANEXAMIC ACID-NACL 1000-0.7 MG/100ML-% IV SOLN
INTRAVENOUS | Status: AC
  Filled 2022-07-16: qty 100

## 2022-07-16 MED ORDER — LACTATED RINGERS IV SOLN: 500.0000 mL | INTRAVENOUS | Status: DC | PRN

## 2022-07-16 MED ORDER — HYDROMORPHONE HCL 1 MG/ML IJ SOLN
2.0000 mg | Freq: Once | INTRAMUSCULAR | Status: AC
  Administered 2022-07-16: 2 mg via INTRAVENOUS
  Filled 2022-07-16: qty 2

## 2022-07-16 MED ORDER — ACETAMINOPHEN 10 MG/ML IV SOLN
INTRAVENOUS | Status: DC | PRN
  Administered 2022-07-16: 1000 mg via INTRAVENOUS

## 2022-07-16 MED ORDER — DEXAMETHASONE SODIUM PHOSPHATE 10 MG/ML IJ SOLN
INTRAMUSCULAR | Status: DC | PRN
  Administered 2022-07-16: 10 mg via INTRAVENOUS

## 2022-07-16 MED ORDER — PROPOFOL 10 MG/ML IV BOLUS
INTRAVENOUS | Status: DC | PRN
  Administered 2022-07-16: 100 mg via INTRAVENOUS

## 2022-07-16 MED ORDER — FENTANYL CITRATE (PF) 100 MCG/2ML IJ SOLN
INTRAMUSCULAR | Status: AC
  Filled 2022-07-16: qty 2

## 2022-07-16 MED ORDER — OXYTOCIN BOLUS FROM INFUSION
333.0000 mL | Freq: Once | INTRAVENOUS | Status: AC
  Administered 2022-07-16: 333 mL via INTRAVENOUS

## 2022-07-16 MED ORDER — OXYCODONE-ACETAMINOPHEN 5-325 MG PO TABS: 2.0000 | ORAL_TABLET | ORAL | Status: DC | PRN

## 2022-07-16 MED ORDER — PHENYLEPHRINE HCL (PRESSORS) 10 MG/ML IV SOLN
INTRAVENOUS | Status: DC | PRN
  Administered 2022-07-16: 80 mg via INTRAVENOUS
  Administered 2022-07-16: 40 mg via INTRAVENOUS
  Administered 2022-07-16: 80 mg via INTRAVENOUS

## 2022-07-16 MED ORDER — CEFAZOLIN SODIUM-DEXTROSE 2-3 GM-%(50ML) IV SOLR
INTRAVENOUS | Status: DC | PRN
  Administered 2022-07-16: 2 g via INTRAVENOUS

## 2022-07-16 MED ORDER — MIDAZOLAM HCL 5 MG/5ML IJ SOLN
INTRAMUSCULAR | Status: DC | PRN
  Administered 2022-07-16: 2 mg via INTRAVENOUS

## 2022-07-16 MED ORDER — ONDANSETRON HCL 4 MG/2ML IJ SOLN
4.0000 mg | Freq: Four times a day (QID) | INTRAMUSCULAR | Status: DC | PRN
  Administered 2022-07-16: 4 mg via INTRAVENOUS

## 2022-07-16 MED ORDER — FENTANYL CITRATE (PF) 100 MCG/2ML IJ SOLN
25.0000 ug | INTRAMUSCULAR | Status: DC | PRN
  Administered 2022-07-16: 50 ug via INTRAVENOUS

## 2022-07-16 SURGICAL SUPPLY — 39 items
ADH SKN CLS APL DERMABOND .7 (GAUZE/BANDAGES/DRESSINGS) ×2
APL PRP STRL LF DISP 70% ISPRP (MISCELLANEOUS) ×2
APL SKNCLS STERI-STRIP NONHPOA (GAUZE/BANDAGES/DRESSINGS) ×1
BENZOIN TINCTURE PRP APPL 2/3 (GAUZE/BANDAGES/DRESSINGS) IMPLANT
CHLORAPREP W/TINT 26 (MISCELLANEOUS) ×2 IMPLANT
CLAMP UMBILICAL CORD (MISCELLANEOUS) ×1 IMPLANT
CLOTH BEACON ORANGE TIMEOUT ST (SAFETY) ×1 IMPLANT
DERMABOND ADVANCED .7 DNX12 (GAUZE/BANDAGES/DRESSINGS) ×2 IMPLANT
DRSG OPSITE POSTOP 3X4 (GAUZE/BANDAGES/DRESSINGS) IMPLANT
DRSG OPSITE POSTOP 4X10 (GAUZE/BANDAGES/DRESSINGS) ×1 IMPLANT
ELECT REM PT RETURN 9FT ADLT (ELECTROSURGICAL) ×1
ELECTRODE REM PT RTRN 9FT ADLT (ELECTROSURGICAL) ×1 IMPLANT
EXTRACTOR VACUUM BELL STYLE (SUCTIONS) IMPLANT
GLOVE BIOGEL PI IND STRL 7.0 (GLOVE) ×1 IMPLANT
GLOVE BIOGEL PI IND STRL 8 (GLOVE) ×1 IMPLANT
GLOVE ECLIPSE 8.0 STRL XLNG CF (GLOVE) ×1 IMPLANT
GOWN STRL REUS W/TWL LRG LVL3 (GOWN DISPOSABLE) ×2 IMPLANT
KIT ABG SYR 3ML LUER SLIP (SYRINGE) ×1 IMPLANT
NDL HYPO 18GX1.5 BLUNT FILL (NEEDLE) ×1 IMPLANT
NDL HYPO 25X5/8 SAFETYGLIDE (NEEDLE) ×1 IMPLANT
NEEDLE HYPO 18GX1.5 BLUNT FILL (NEEDLE) ×1 IMPLANT
NEEDLE HYPO 22GX1.5 SAFETY (NEEDLE) ×1 IMPLANT
NEEDLE HYPO 25X5/8 SAFETYGLIDE (NEEDLE) ×1 IMPLANT
NS IRRIG 1000ML POUR BTL (IV SOLUTION) ×1 IMPLANT
PACK C SECTION WH (CUSTOM PROCEDURE TRAY) ×1 IMPLANT
PAD OB MATERNITY 4.3X12.25 (PERSONAL CARE ITEMS) ×1 IMPLANT
RTRCTR C-SECT PINK 25CM LRG (MISCELLANEOUS) IMPLANT
SUT CHROMIC 0 CT 1 (SUTURE) ×1 IMPLANT
SUT MNCRL 0 VIOLET CTX 36 (SUTURE) ×2 IMPLANT
SUT MONOCRYL 0 CTX 36 (SUTURE) ×2
SUT PLAIN 2 0 (SUTURE)
SUT PLAIN 2 0 XLH (SUTURE) IMPLANT
SUT PLAIN ABS 2-0 CT1 27XMFL (SUTURE) IMPLANT
SUT VIC AB 0 CTX 36 (SUTURE) ×1
SUT VIC AB 0 CTX36XBRD ANBCTRL (SUTURE) ×1 IMPLANT
SUT VIC AB 4-0 KS 27 (SUTURE) IMPLANT
TOWEL OR 17X24 6PK STRL BLUE (TOWEL DISPOSABLE) ×1 IMPLANT
TRAY FOLEY W/BAG SLVR 14FR LF (SET/KITS/TRAYS/PACK) IMPLANT
WATER STERILE IRR 1000ML POUR (IV SOLUTION) ×1 IMPLANT

## 2022-07-16 NOTE — MAU Note (Signed)
Received call from registration, pt wanting to push.   Went to lobby, pt didn't respond when called name,  went to registration, pt pointed out.  Was calmly sitting in chair on phone, with legs crossed.  Pt taken to rm

## 2022-07-16 NOTE — Anesthesia Procedure Notes (Signed)
Procedure Name: Intubation Date/Time: 07/16/2022 9:38 PM  Performed by: Clovis Cao, CRNAPre-anesthesia Checklist: Patient identified, Emergency Drugs available, Suction available and Patient being monitored Patient Re-evaluated:Patient Re-evaluated prior to induction Oxygen Delivery Method: Circle system utilized Preoxygenation: Pre-oxygenation with 100% oxygen Induction Type: IV induction, Rapid sequence and Cricoid Pressure applied Laryngoscope Size: Glidescope and 3 Grade View: Grade I Tube type: Oral Tube size: 7.0 mm Number of attempts: 1 Airway Equipment and Method: Stylet and Video-laryngoscopy Placement Confirmation: ETT inserted through vocal cords under direct vision, positive ETCO2 and breath sounds checked- equal and bilateral Secured at: 21 cm Tube secured with: Tape Dental Injury: Teeth and Oropharynx as per pre-operative assessment

## 2022-07-16 NOTE — Lactation Note (Signed)
This note was copied from a baby's chart. Lactation Consultation Note  Patient Name: Crystal Graves [id number] Date: 07/16/2022   Age:36 hours Mom's choice of feeding is formula. Maternal Data    Feeding    LATCH Score                    Lactation Tools Discussed/Used    Interventions    Discharge    Consult Status Consult Status: Complete    Zakary Kimura G 07/16/2022, 7:51 PM

## 2022-07-16 NOTE — MAU Note (Signed)
.  Crystal Graves is a 36 y.o. at [redacted]w[redacted]d here in MAU reporting: contractions since this am. Denies bleeding or ROM. Reports positive fetal movement. Reported pressure and urge to push at admission desk.  Onset of complaint: this am Pain score: 7/10 Vitals:   07/16/22 1304 07/16/22 1305  BP: 117/82   Pulse: 82   Resp: 15   Temp: 98.4 F (36.9 C)   SpO2: 100% 100%     FHT:137 Lab orders placed from triage:

## 2022-07-16 NOTE — Op Note (Signed)
Pre-op diagnosis: Multiparous female desires permanent sterilization                                Retained placenta  Postoperative diagnosis: Same as above  Procedure:  1.  Manual removal of retained placenta                     2.  Postpartum bilateral tubal ligation, specifically partial salpingectomy fimbriectomy bilaterally  Surgeon:  Crystal Buff, MD   Anesthesia: General endotracheal  Findings: After delivery the patient was noted to have a retained placenta After about 40 minutes postpartum I was called into the room for assessment I confirmed that the placenta was densely adherent to the fundus She did not have regional anesthesia IV pain medicine was an adequate I consulted Dr. Lanetta Graves and she gave nitroglycerin IV without success to see if that would loosen the uterine contraction That point decided that the most appropriate course was a manual removal of the placenta under anesthesia Dr. Lanetta Graves decided general anesthesia would be appropriate The patient also wanted a bilateral tube ligation for permanent sterilization there preparations were made for that surgery  Intraoperatively the placenta was densely adherent to the fundus of the uterus but not morbidly adherent.  Intraperitoneal findings were normal  Description of operation: Patient was taken the operating room and placed in the supine position where she underwent general endotracheal anesthesia Foley catheter was placed Vagina was prepped Was intending to perform the bilateral tubal ligation first because that was a sterile field and then manual placental removal would be nonsterile, however the placenta was well above the umbilicus and displaced to the right making a tubal ligation in normal fashion and possible As a result I decided to proceed with a manual removal of the placenta first  With the help of general anesthesia I was able to put my entire hand and the uterus and find a cleavage plane in the  fundus of the uterus and was able to get above the entire placental surface and remove the placenta intact without difficulty  There was little bleeding after removal Amount of blood that was in the uterus at the time was about 400 cc total  We then put the patient's legs down prepped draped the abdomen in sterile fashion to perform a postpartum bilateral tubal ligation  An elliptical incision was made below the umbilicus and carried down sharply to the rectus fascia The fascia was incised and the peritoneal cavity was entered The right fallopian tube was identified Kelly clamp was placed along about two thirds of the length of the tube including the fimbriated end This portion of tube was removed sharply Fore and aft suture ligation was performed  Same procedure was carried out on the left Left fallopian tube was identified Was crossclamped including the fimbriated end About two thirds of the length of the tube was removed including the fimbriated end 4 and aft suture ligation was performed Was good hemostasis of both pedicles Fascia was closed using 0 Vicryl running Skin was closed using skin staples Dermabond was placed Honeycomb dressing was placed  The patient tolerated the procedure well Experience no real intraoperative blood loss There was 400 cc of blood that was expelled when I did the manual removal of placenta but it was there at the time of removal The uterus was firm and really did not bleed thereafter Was no blood loss  for the tubal ligation  The patient was taken to the recovery in good stable condition all counts were correct x 3 Received 2 g of Ancef and 30 mg of Toradol preoperatively prophylactically  Crystal Buff, MD 07/16/2022 11:40 PM

## 2022-07-16 NOTE — Anesthesia Preprocedure Evaluation (Signed)
Anesthesia Evaluation  Patient identified by MRN, date of birth, ID band Patient awake    Reviewed: Allergy & Precautions, NPO status , Patient's Chart, lab work & pertinent test results  Airway Mallampati: II  TM Distance: >3 FB Neck ROM: Full    Dental no notable dental hx.    Pulmonary neg pulmonary ROS   Pulmonary exam normal breath sounds clear to auscultation       Cardiovascular negative cardio ROS Normal cardiovascular exam Rhythm:Regular Rate:Normal     Neuro/Psych negative neurological ROS  negative psych ROS   GI/Hepatic negative GI ROS, Neg liver ROS,,,  Endo/Other  negative endocrine ROS    Renal/GU negative Renal ROS  negative genitourinary   Musculoskeletal negative musculoskeletal ROS (+)    Abdominal   Peds  Hematology negative hematology ROS (+)   Anesthesia Other Findings   Reproductive/Obstetrics                             Anesthesia Physical Anesthesia Plan  ASA: 2  Anesthesia Plan: General   Post-op Pain Management: Ofirmev IV (intra-op)*   Induction: Intravenous  PONV Risk Score and Plan: 3 and Midazolam, Dexamethasone and Ondansetron  Airway Management Planned: Oral ETT  Additional Equipment:   Intra-op Plan:   Post-operative Plan: Extubation in OR  Informed Consent: I have reviewed the patients History and Physical, chart, labs and discussed the procedure including the risks, benefits and alternatives for the proposed anesthesia with the patient or authorized representative who has indicated his/her understanding and acceptance.     Dental advisory given  Plan Discussed with: CRNA  Anesthesia Plan Comments:        Anesthesia Quick Evaluation

## 2022-07-16 NOTE — Transfer of Care (Signed)
Immediate Anesthesia Transfer of Care Note  Patient: Crystal Graves  Procedure(s) Performed: BILATERAL TUBAL LIGATION  Patient Location: PACU  Anesthesia Type:General  Level of Consciousness: awake and alert Oriented  Airway & Oxygen Therapy: Patient Spontanous Breathing and Patient connected to face mask  Post-op Assessment: Report given to RN, Post -op Vital signs reviewed and stable, and Patient moving all extremities X 4  Post vital signs: Reviewed and stable  Last Vitals:  Vitals Value Taken Time  BP 109/68 07/16/22 2251  Temp    Pulse 90 07/16/22 2259  Resp 23 07/16/22 2259  SpO2 100 % 07/16/22 2259  Vitals shown include unvalidated device data.  Last Pain:  Vitals:   07/16/22 2059  TempSrc: Oral  PainSc:       Patients Stated Pain Goal: 2 ([id number])  Complications: No notable events documented.

## 2022-07-16 NOTE — Discharge Summary (Signed)
Postpartum Discharge Summary    Patient Name: Crystal Graves DOB: [birth year] MRN: [id number]  Date of admission: 07/16/2022 Delivery date:07/16/2022  Delivering provider: Manya Silvas  Date of discharge: 07/18/2022  Admitting diagnosis: Labor and delivery indication for care or intervention [O75.9] Intrauterine pregnancy: [redacted]w[redacted]d    Secondary diagnosis:  Principal Problem:   Vaginal delivery Active Problems:   Encounter for female sterilization procedure   Labor and delivery indication for care or intervention   Retained placenta  Additional problems: Retained placenta - extracted in the OR   Discharge diagnosis: Term Pregnancy Delivered                                              Post partum procedures: IV iron Augmentation:  None Complications: Retained placenta  Hospital course: Onset of Labor With Vaginal Delivery      37y.o. yo [id number] admitted in Latent Labor on 07/16/2022. Labor course was complicated by nothing  Membrane Rupture Time/Date: 7:12 PM ,07/16/2022   Delivery Method:Vaginal, Spontaneous  Episiotomy: None  Lacerations:  None None Patient had a postpartum course complicated by retained placenta and anemia. See Op note.  She is ambulating, tolerating a regular diet, passing flatus, and urinating well. Patient is discharged home in stable condition on 07/18/22.  Newborn Data: Birth date:07/16/2022  Birth time:7:23 PM  Gender:Female  Living status:Living  Apgars:9 ,9  Weight:2900 g   Magnesium Sulfate received: No BMZ received: No Rhophylac:N/A MMR:N/A T-DaP:Given prenatally Flu: Yes Transfusion:No  Physical exam  Vitals:   07/17/22 0800 07/17/22 1255 07/17/22 2102 07/18/22 0557  BP:  101/68 104/66 95/65  Pulse:  79 74 74  Resp: 18 18 18 18  $ Temp: 98.7 F (37.1 C) 98.4 F (36.9 C) 98.9 F (37.2 C) 98.6 F (37 C)  TempSrc: Oral Oral Oral Oral  SpO2: 98% 100% 100% 100%  Weight:      Height:       General: alert,  cooperative, and no distress Lochia: appropriate Uterine Fundus: firm Incision: N/A DVT Evaluation: No evidence of DVT seen on physical exam. No significant calf/ankle edema. Labs: Lab Results  Component Value Date   WBC 18.4 (H) 07/17/2022   HGB 7.9 (L) 07/17/2022   HCT 22.9 (L) 07/17/2022   MCV 88.4 07/17/2022   PLT 162 07/17/2022      Latest Ref Rng & Units 06/18/2019    7:38 AM  CMP  Glucose 70 - 99 mg/dL 81   BUN 6 - 23 mg/dL 14   Creatinine 0.40 - 1.20 mg/dL 0.68   Sodium 135 - 145 mEq/L 138   Potassium 3.5 - 5.1 mEq/L 3.8   Chloride 96 - 112 mEq/L 102   CO2 19 - 32 mEq/L 29   Calcium 8.4 - 10.5 mg/dL 9.5   Total Protein 6.0 - 8.3 g/dL 7.5   Total Bilirubin 0.2 - 1.2 mg/dL 0.8   Alkaline Phos 39 - 117 U/L 64   AST 0 - 37 U/L 19   ALT 0 - 35 U/L 17    Edinburgh Score:    07/17/2022    7:28 PM  Edinburgh Postnatal Depression Scale Screening Tool  I have been able to laugh and see the funny side of things. 0  I have looked forward with enjoyment to things. 0  I have blamed myself unnecessarily  when things went wrong. 1  I have been anxious or worried for no good reason. 0  I have felt scared or panicky for no good reason. 1  Things have been getting on top of me. 0  I have been so unhappy that I have had difficulty sleeping. 0  I have felt sad or miserable. 0  I have been so unhappy that I have been crying. 0  The thought of harming myself has occurred to me. 0  Edinburgh Postnatal Depression Scale Total 2     After visit meds:  Allergies as of 07/18/2022   No Known Allergies      Medication List     TAKE these medications    acetaminophen 325 MG tablet Commonly known as: Tylenol Take 2 tablets (650 mg total) by mouth every 4 (four) hours as needed (for pain scale < 4).   benzocaine-Menthol 20-0.5 % Aero Commonly known as: DERMOPLAST Apply 1 Application topically as needed for irritation (perineal discomfort).   fluticasone 50 MCG/ACT nasal  spray Commonly known as: FLONASE Place 2 sprays into both nostrils daily.   ibuprofen 600 MG tablet Commonly known as: ADVIL Take 1 tablet (600 mg total) by mouth every 6 (six) hours.   multivitamin-prenatal 27-0.8 MG Tabs tablet Take 1 tablet by mouth daily at 12 noon.   Senexon-S 8.6-50 MG tablet Generic drug: senna-docusate Take 2 tablets by mouth daily for 5 days.         Discharge home in stable condition Infant Feeding: Bottle and Breast Infant Disposition:home with mother Discharge instruction: per After Visit Summary and Postpartum booklet. Activity: Advance as tolerated. Pelvic rest for 6 weeks.  Diet: routine diet Future Appointments: Future Appointments  Date Time Provider [place]  08/15/2022  1:10 PM Aletha Halim, MD [facility]   Follow up Visit:   Please schedule this patient for a In person postpartum visit in 4 weeks with the following provider: Any provider. Additional Postpartum F/U: none   Low risk pregnancy complicated by:  Delivery mode:  Vaginal, Spontaneous  Anticipated Birth Control:   BTL   07/18/2022 Shelda Pal, DO

## 2022-07-16 NOTE — H&P (Cosign Needed Addendum)
HPI: Crystal Graves is a 37 y.o. year old G56P3013 female at 13w0dweeks gestation who presents to MAU reporting Labor. Denies ROM or VB. + FM. Progressed 4-7 cm in MAU.    Nursing Staff Provider  Office Location  [place]  Dating  7 week scan not c/w LMP  Language  Spanish  Anatomy [place] NConstance Holster Flu Vaccine  04/11/22 Genetic/Carrier Screen  NIPS:   Low risks AFP:    [place]:  TDaP Vaccine   05/25/22 Hgb A1C or  GTT Early A1C 5.3 Third trimester  1 hr GTT 121  COVID Vaccine    LAB RESULTS   Rhogam  NA Blood Type AB/Positive/-- (08/08 1515)   Baby Feeding Plan Both Antibody Negative (08/08 1515)  Contraception BTL (BCBS) Rubella 1.96 (08/08 1515)  Circumcision No RPR Non Reactive (12/21 0901)   Pediatrician  [place]Bs[place] Negative (08/08 1515)   Support Person FOB  HCVAb Negative (08/08 1515)  Prenatal Classes no HIV Non Reactive (12/21 0901)     BTL Consent N/A, has BCBS GBS  Neg     Pap 08/04/21 normal       DME Rx [ ]$  BP cuff [ ]$  Weight Scale    PHQ9 & GAD7 [ X ] new OB [  ] 28 weeks  [  ] 36 weeks Induction  [ ]$  Orders Entered [ ]$ Foley Y/N    OB History     Gravida  5   Para  3   Term  3   Preterm  0   AB  1   Living  3      SAB  1   IAB  0   Ectopic  0   Multiple  0   Live Births  3          Past Medical History:  Diagnosis Date   ASCUS with positive high risk HPV cervical    Chlamydia infection 06/29/2017   Endocervical polyp 08/04/2021   Moderate dysplasia of cervix (CIN II) 11/11/2014   CIN II noted on colposcopy done after LGSIL pap on 11/10/14 Cryotherapy done on 12/10/14 06/22/15 Normal cytology on pap smear with positive HRHPV, but negative HPV 16 and 18/45 -> repeat cotesting in one year 06/27/16  ASCUS +HPV  Colposcopy showed CIN II, LEEP recommeded LEEP on 09/05/2016 showed CIN I.    Past Surgical History:  Procedure Laterality Date   COLPOSCOPY W/ BIOPSY / CURETTAGE  11/10/2014   Done for LGSIL pap smear   GYNECOLOGIC CRYOSURGERY   12/10/2014   Done for CIN II (colposcopy pathology after LGSIL pap)   INSERTION OF IMPLANON ROD  09/21/2020       POLYPECTOMY  08/04/2021   REMOVAL OF IMPLANON ROD  09/07/2021   Family History: family history includes Hypertension in her father. Social History:  reports that she has never smoked. She has never used smokeless tobacco. She reports that she does not currently use alcohol. She reports that she does not use drugs.     Maternal Diabetes: No Genetic Screening: Normal Maternal Ultrasounds/Referrals: Normal Fetal Ultrasounds or other Referrals:  None Maternal Substance Abuse:  No Significant Maternal Medications:  None Significant Maternal Lab Results:  Group B Strep negative Number of Prenatal Visits:greater than 3 verified prenatal visits Other Comments:  None  Review of Systems  Constitutional:  Negative for chills and fever.  Gastrointestinal:  Positive for abdominal pain.  Genitourinary:  Negative for vaginal bleeding.  Neurological:  Negative  for headaches.   History VE: 7/100/-2  07/16/22 1748 98.6 F (37 C) 74 -- 16 137/87   Maternal Exam:  Uterine Assessment: Contraction strength is firm.  Contraction frequency is regular.  Abdomen: Patient reports no abdominal tenderness. Estimated fetal weight is 6.5.   Fetal presentation: vertex Introitus: Normal vulva. Normal vagina.  Pelvis: adequate for delivery.   Cervix: Cervix evaluated by digital exam.     Fetal Exam Fetal Monitor Review: Baseline rate: 130.  Variability: moderate (6-25 bpm).   Pattern: accelerations present and no decelerations.   Fetal State Assessment: Category I - tracings are normal.   Physical Exam Constitutional:      General: She is not in acute distress.    Appearance: Normal appearance. She is normal weight. She is not toxic-appearing.  HENT:     Head: Normocephalic.  Eyes:     Conjunctiva/sclera: Conjunctivae normal.  Cardiovascular:     Rate and Rhythm: Normal rate.   Pulmonary:     Effort: Pulmonary effort is normal.  Genitourinary:    General: Normal vulva.  Musculoskeletal:     Right lower leg: No edema.     Left lower leg: No edema.  Skin:    General: Skin is warm and dry.  Neurological:     Mental Status: She is alert and oriented to person, place, and time.     Prenatal labs: ABO, Rh: --/--/AB POS (02/11 1630) Antibody: NEG (02/11 1630) Rubella: 1.96 (08/08 1515) RPR: Non Reactive (12/21 0901)  HBsAg: Negative (08/08 1515)  HIV: Non Reactive (12/21 0901)  GBS: Negative/-- (02/08 1355)   Assessment: 1. Labor: active 2. Fetal Wellbeing: Category I  3. Pain Control: Comfort measures, Nubain 4. GBS: neg 5. 37.0 week IUP  Plan:  1. Admit to BS per consult with MD 2. Routine L&D orders 3. Analgesia/anesthesia PRN  4. Plans BTL (BCBS)  Manya Silvas 07/16/2022, 9:10 PM

## 2022-07-17 ENCOUNTER — Encounter (HOSPITAL_COMMUNITY): Payer: Self-pay | Admitting: Obstetrics & Gynecology

## 2022-07-17 LAB — CBC
HCT: 22.9 % — ABNORMAL LOW (ref 36.0–46.0)
Hemoglobin: 7.9 g/dL — ABNORMAL LOW (ref 12.0–15.0)
MCH: 30.5 pg (ref 26.0–34.0)
MCHC: 34.5 g/dL (ref 30.0–36.0)
MCV: 88.4 fL (ref 80.0–100.0)
Platelets: 162 10*3/uL (ref 150–400)
RBC: 2.59 MIL/uL — ABNORMAL LOW (ref 3.87–5.11)
RDW: 12.4 % (ref 11.5–15.5)
WBC: 18.4 10*3/uL — ABNORMAL HIGH (ref 4.0–10.5)
nRBC: 0 % (ref 0.0–0.2)

## 2022-07-17 LAB — RPR: RPR Ser Ql: NONREACTIVE

## 2022-07-17 MED ORDER — OXYCODONE HCL 5 MG PO TABS: 10.0000 mg | ORAL_TABLET | ORAL | Status: DC | PRN

## 2022-07-17 MED ORDER — ONDANSETRON HCL 4 MG/2ML IJ SOLN: 4.0000 mg | INTRAMUSCULAR | Status: DC | PRN

## 2022-07-17 MED ORDER — IBUPROFEN 600 MG PO TABS
600.0000 mg | ORAL_TABLET | Freq: Four times a day (QID) | ORAL | Status: DC
  Administered 2022-07-17 – 2022-07-18 (×5): 600 mg via ORAL
  Filled 2022-07-17 (×5): qty 1

## 2022-07-17 MED ORDER — WITCH HAZEL-GLYCERIN EX PADS: 1.0000 | MEDICATED_PAD | CUTANEOUS | Status: DC | PRN

## 2022-07-17 MED ORDER — BENZOCAINE-MENTHOL 20-0.5 % EX AERO: 1.0000 | INHALATION_SPRAY | CUTANEOUS | Status: DC | PRN

## 2022-07-17 MED ORDER — SIMETHICONE 80 MG PO CHEW: 80.0000 mg | CHEWABLE_TABLET | ORAL | Status: DC | PRN

## 2022-07-17 MED ORDER — ACETAMINOPHEN 325 MG PO TABS
650.0000 mg | ORAL_TABLET | ORAL | Status: DC | PRN
  Filled 2022-07-17: qty 2

## 2022-07-17 MED ORDER — DIBUCAINE (PERIANAL) 1 % EX OINT: 1.0000 | TOPICAL_OINTMENT | CUTANEOUS | Status: DC | PRN

## 2022-07-17 MED ORDER — DIPHENHYDRAMINE HCL 25 MG PO CAPS: 25.0000 mg | ORAL_CAPSULE | Freq: Four times a day (QID) | ORAL | Status: DC | PRN

## 2022-07-17 MED ORDER — OXYCODONE HCL 5 MG PO TABS: 5.0000 mg | ORAL_TABLET | ORAL | Status: DC | PRN

## 2022-07-17 MED ORDER — SODIUM CHLORIDE 0.9 % IV SOLN
500.0000 mg | Freq: Once | INTRAVENOUS | Status: AC
  Administered 2022-07-17: 500 mg via INTRAVENOUS
  Filled 2022-07-17: qty 25

## 2022-07-17 MED ORDER — TETANUS-DIPHTH-ACELL PERTUSSIS 5-2.5-18.5 LF-MCG/0.5 IM SUSY: 0.5000 mL | PREFILLED_SYRINGE | Freq: Once | INTRAMUSCULAR | Status: DC

## 2022-07-17 MED ORDER — SENNOSIDES-DOCUSATE SODIUM 8.6-50 MG PO TABS: 2.0000 | ORAL_TABLET | ORAL | Status: DC

## 2022-07-17 MED ORDER — PRENATAL MULTIVITAMIN CH
1.0000 | ORAL_TABLET | Freq: Every day | ORAL | Status: DC
  Administered 2022-07-17: 1 via ORAL
  Filled 2022-07-17: qty 1

## 2022-07-17 MED ORDER — ZOLPIDEM TARTRATE 5 MG PO TABS: 5.0000 mg | ORAL_TABLET | Freq: Every evening | ORAL | Status: DC | PRN

## 2022-07-17 MED ORDER — COCONUT OIL OIL: 1.0000 | TOPICAL_OIL | Status: DC | PRN

## 2022-07-17 MED ORDER — ONDANSETRON HCL 4 MG PO TABS: 4.0000 mg | ORAL_TABLET | ORAL | Status: DC | PRN

## 2022-07-17 NOTE — Anesthesia Postprocedure Evaluation (Signed)
Anesthesia Post Note  Patient: [place]  Procedure(s) Performed: BILATERAL TUBAL LIGATION     Patient location during evaluation: PACU Anesthesia Type: General Level of consciousness: awake and alert Pain management: pain level controlled Vital Signs Assessment: post-procedure vital signs reviewed and stable Respiratory status: spontaneous breathing, nonlabored ventilation, respiratory function stable and patient connected to nasal cannula oxygen Cardiovascular status: blood pressure returned to baseline and stable Postop Assessment: no apparent nausea or vomiting Anesthetic complications: no  No notable events documented.  Last Vitals:  Vitals:   07/17/22 0138 07/17/22 0524  BP: 106/67 108/68  Pulse: 72 90  Resp: 18 18  Temp: 37.3 C 37.6 C  SpO2: 96% 97%    Last Pain:  Vitals:   07/17/22 0524  TempSrc: Oral  PainSc: 5    Pain Goal: Patients Stated Pain Goal: 0 (07/16/22 2348)                 Kaylen Motl L Kiyon Fidalgo

## 2022-07-17 NOTE — Progress Notes (Signed)
POSTPARTUM PROGRESS NOTE  Post Partum Day 1  Subjective:  Crystal Graves is a 36 y.o. [id number] s/p VD and ppBTL at [redacted]w[redacted]d  She reports she is doing well. Delivery was complicated by retained placenta that was firmly adherent and required manual extraction under general anesthesia in the OR. She had her BTL at the same time. She denies any problems with ambulating, voiding or po intake. Denies nausea or vomiting.  Pain is well controlled.  Lochia is minimal.  Objective: Blood pressure 108/68, pulse 90, temperature 98.7 F (37.1 C), temperature source Oral, resp. rate 18, height 5' 6"$  (1.676 m), weight 66.5 kg, last menstrual period 10/17/2021, SpO2 98 %, unknown if currently breastfeeding.  Physical Exam:  General: alert, cooperative and no distress Chest: no respiratory distress Heart:regular rate, distal pulses intact Abdomen: soft, nontender, BTL site shows clean and dry dressing. Uterine Fundus: firm, appropriately tender DVT Evaluation: No calf swelling or tenderness Extremities: no edema Skin: warm, dry  Recent Labs    07/16/22 1736 07/17/22 0525  HGB 12.4 7.9*  HCT 37.9 22.9*    Assessment/Plan: Crystal Plancarte Crystal Needsis a 37y.o. [id number]/p VD and BTL at 332w0d PPD#1 and POD#1 - Doing well  Routine postpartum care Moderate anemia - asymptomatic.  - IV iron sucrose Contraception: BTL done Feeding: breast and bottle Dispo: Plan for discharge tomorrow.   LOS: 1 day   ChLiliane ChannelD MPH OB Fellow, [place]/05/2023

## 2022-07-18 ENCOUNTER — Other Ambulatory Visit (HOSPITAL_COMMUNITY): Payer: Self-pay

## 2022-07-18 LAB — SURGICAL PATHOLOGY

## 2022-07-18 MED ORDER — ACETAMINOPHEN 325 MG PO TABS
650.0000 mg | ORAL_TABLET | ORAL | 0 refills | Status: DC | PRN
  Filled 2022-07-18: qty 30, 3d supply, fill #0

## 2022-07-18 MED ORDER — IBUPROFEN 600 MG PO TABS
600.0000 mg | ORAL_TABLET | Freq: Four times a day (QID) | ORAL | 0 refills | Status: DC
  Filled 2022-07-18: qty 30, 8d supply, fill #0

## 2022-07-18 MED ORDER — BENZOCAINE-MENTHOL 20-0.5 % EX AERO
1.0000 | INHALATION_SPRAY | CUTANEOUS | 1 refills | Status: DC | PRN
  Filled 2022-07-18: qty 78, fill #0

## 2022-07-18 MED ORDER — SENNOSIDES-DOCUSATE SODIUM 8.6-50 MG PO TABS
2.0000 | ORAL_TABLET | ORAL | 0 refills | Status: AC
  Filled 2022-07-18: qty 10, 5d supply, fill #0

## 2022-07-18 NOTE — Discharge Instructions (Signed)
-   Continue your prenatal vitamins especially if breastfeeding - Try to eat iron rich food. - Take iron supplement as prescribed every other day. Take along with fruit or orange juice, avoid taking within 30 mins of eating dairy (milk, cheese) - Take over the counter tylenol (500mg) or ibuprofen (200mg) three times a day as needed for cramping/pain. - fFollow up in clinic in 4-6 weeks as scheduled for your regular post partum visit. - Please come back to [facility]U if you notice persistently elevated blood pressures or you start to have a headache, that doesn't get better with medications (tylenol and ibuprofen), rest (4hrs of sleep) and drinking water.  

## 2022-07-18 NOTE — Lactation Note (Signed)
This note was copied from a baby's chart. Lactation Consultation Note  Patient Name: Crystal Graves [id number] Date: 07/18/2022 Age : 36 hours old  Reason for consult: Initial assessment;Early term 37-38.6wks;Infant weight loss LC reviewed supply and demand, importance of giving baby a lot of practice at the breast , if still hungry after the 1st breast offer the 2nd breast.  If the baby is sluggish to start since she has had bottles , try and appetizer from the bottle and then latch .  LC reviewed BF D/C teaching and the [facility] resources.  Maternal Data Has patient been taught Hand Expression?: Yes Does the patient have breastfeeding experience prior to this delivery?: Yes  Feeding Mother's Current Feeding Choice: Breast Milk and Formula Nipple Type: Slow - flow  LATCH Score Latch: Grasps breast easily, tongue down, lips flanged, rhythmical sucking.  Audible Swallowing: A few with stimulation  Type of Nipple: Everted at rest and after stimulation  Comfort (Breast/Nipple): Soft / non-tender  Hold (Positioning): Assistance needed to correctly position infant at breast and maintain latch. [facility] showed mom how to flip the upper lips and ease down the chin for depth.)  LATCH Score: 8   Lactation Tools Discussed/Used    Interventions Interventions: Breast feeding basics reviewed;Assisted with latch;Skin to skin;Hand express;Breast compression;Adjust position;Support pillows;Position options;Education;LC Services brochure  Discharge Discharge Education: Engorgement and breast care;Warning signs for feeding baby Pump: DEBP;Personal  Consult Status Consult Status: Complete    Jerlyn Ly Gurpreet Mikhail 07/18/2022, 9:28 AM

## 2022-07-19 ENCOUNTER — Telehealth: Payer: Self-pay | Admitting: *Deleted

## 2022-07-19 NOTE — Transitions of Care (Post Inpatient/ED Visit) (Signed)
   07/19/2022  Name: Crystal Graves MRN: [id number] DOB: 12-07-1985  Today's TOC FU Call Status: Today's TOC FU Call Status:: Successful TOC FU Call Competed TOC FU Call Complete Date: 07/19/22  Transition Care Management Follow-up Telephone Call Date of Discharge: 07/18/22 [facility] Type of Discharge: Inpatient Admission Primary Inpatient Discharge Diagnosis:: NVD How have you been since you were released from the hospital?: Better Any questions or concerns?: No  Items Reviewed: Did you receive and understand the discharge instructions provided?: Yes Medications obtained and verified?: Yes (Medications Reviewed) Any new allergies since your discharge?: No Dietary orders reviewed?: No Do you have support at home?: Yes People in Home: other relative(s) Name of Support/Comfort Primary Source: [facility] and Equipment/Supplies: Were Home Health Services Ordered?: No Any new equipment or medical supplies ordered?: No  Functional Questionnaire: Do you need assistance with bathing/showering or dressing?: No Do you need assistance with meal preparation?: No Do you need assistance with eating?: No Do you have difficulty maintaining continence: No Do you need assistance with getting out of bed/getting out of a chair/moving?: No Do you have difficulty managing or taking your medications?: No  Folllow up appointments reviewed: PCP Follow-up appointment confirmed?: No (Patient is not sure if she wants a PCP in [place]. Current address is [address]) MD Provider Line [phone] Given: No Specialist Hospital Follow-up appointment confirmed?: Yes Date of Specialist follow-up appointment?: 08/15/22 Follow-Up Specialty Provider:: Dr Ilda Basset 110 Do you need transportation to your follow-up appointment?: No Do you understand care options if  your condition(s) worsen?: Yes-patient verbalized understanding  SDOH Interventions Today    Flowsheet Row Most Recent Value  SDOH Interventions   Food Insecurity Interventions Intervention Not Indicated  Transportation Interventions Intervention Not Indicated          [place] [facility] [phone]

## 2022-07-20 ENCOUNTER — Encounter: Payer: BC Managed Care – PPO | Admitting: Obstetrics and Gynecology

## 2022-07-24 ENCOUNTER — Encounter: Payer: Self-pay | Admitting: *Deleted

## 2022-07-25 ENCOUNTER — Encounter: Payer: Self-pay | Admitting: *Deleted

## 2022-07-27 ENCOUNTER — Encounter: Payer: BC Managed Care – PPO | Admitting: Advanced Practice Midwife

## 2022-07-27 ENCOUNTER — Telehealth (HOSPITAL_COMMUNITY): Payer: Self-pay | Admitting: *Deleted

## 2022-07-27 NOTE — Telephone Encounter (Signed)
Left phone voicemail message.  Odis Hollingshead, RN 07-27-2022 at 3:18pm

## 2022-08-03 ENCOUNTER — Encounter: Payer: BC Managed Care – PPO | Admitting: Obstetrics and Gynecology

## 2022-08-15 ENCOUNTER — Ambulatory Visit (INDEPENDENT_AMBULATORY_CARE_PROVIDER_SITE_OTHER): Payer: BC Managed Care – PPO | Admitting: Obstetrics and Gynecology

## 2022-08-15 VITALS — BP 98/59 | HR 64 | Wt 128.0 lb

## 2022-08-15 DIAGNOSIS — O9081 Anemia of the puerperium: Secondary | ICD-10-CM

## 2022-08-15 DIAGNOSIS — Z1339 Encounter for screening examination for other mental health and behavioral disorders: Secondary | ICD-10-CM

## 2022-08-15 LAB — CBC
Hematocrit: 35.1 % (ref 34.0–46.6)
Hemoglobin: 11.3 g/dL (ref 11.1–15.9)
MCH: 28.8 pg (ref 26.6–33.0)
MCHC: 32.2 g/dL (ref 31.5–35.7)
MCV: 89 fL (ref 79–97)
Platelets: 233 10*3/uL (ref 150–450)
RBC: 3.93 x10E6/uL (ref 3.77–5.28)
RDW: 12.5 % (ref 11.7–15.4)
WBC: 3.3 10*3/uL — ABNORMAL LOW (ref 3.4–10.8)

## 2022-08-15 NOTE — Progress Notes (Signed)
    [place] Partum Visit Note  Dashawn Golda Annitta Needs is a 37 y.o. [id number] SVD/intact perineum at 37wks after presenting in active labor. Delivery c/b retained placenta and need for d&c with ppBTL done at the same time. Anesthesia: none. Postpartum course has been well. Baby is doing well. Baby is feeding by breast only. Bleeding no bleeding. Bowel function is normal. Bladder function is normal. Patient is not sexually active. Contraception method is tubal ligation. Postpartum depression screening: negative.  pap and hpv negative 2023   Edinburgh Postnatal Depression Scale - 08/15/22 1331       Edinburgh Postnatal Depression Scale:  In the Past 7 Days   I have looked forward with enjoyment to things. 0    I have blamed myself unnecessarily when things went wrong. 0    I have been anxious or worried for no good reason. 0    I have felt scared or panicky for no good reason. 0    Things have been getting on top of me. 1    I have been so unhappy that I have had difficulty sleeping. 0    I have felt sad or miserable. 0    I have been so unhappy that I have been crying. 0    The thought of harming myself has occurred to me. 0            Review of Systems A comprehensive review of systems was negative.  Objective:  BP (!) 98/59   Pulse 64   Wt 128 lb (58.1 kg)   LMP 10/17/2021 (Approximate)   BMI 20.66 kg/m    General: NAD Abdomen: soft, nttp, nd. Well healed umbilical incision, c/d/I, no hernia  Assessment:   Normal PP visit  Plan:  Routine care. No s/s of anemia but will get CBC since PP Hgb 7.9 and not discharged home on iron. Recommend repeat pap smear in 3 years  RTC: PRN  Aletha Halim, MD [facility]

## 2023-08-06 ENCOUNTER — Encounter: Payer: Self-pay | Admitting: General Practice

## 2023-08-06 ENCOUNTER — Ambulatory Visit: Payer: BC Managed Care – PPO | Admitting: General Practice

## 2023-08-06 VITALS — BP 102/68 | HR 75 | Temp 98.1°F | Ht 66.5 in | Wt 135.0 lb

## 2023-08-06 DIAGNOSIS — H6123 Impacted cerumen, bilateral: Secondary | ICD-10-CM | POA: Diagnosis not present

## 2023-08-06 DIAGNOSIS — Z1329 Encounter for screening for other suspected endocrine disorder: Secondary | ICD-10-CM | POA: Diagnosis not present

## 2023-08-06 DIAGNOSIS — Z0001 Encounter for general adult medical examination with abnormal findings: Secondary | ICD-10-CM

## 2023-08-06 DIAGNOSIS — Z0000 Encounter for general adult medical examination without abnormal findings: Secondary | ICD-10-CM | POA: Insufficient documentation

## 2023-08-06 DIAGNOSIS — J029 Acute pharyngitis, unspecified: Secondary | ICD-10-CM | POA: Insufficient documentation

## 2023-08-06 DIAGNOSIS — Z13220 Encounter for screening for lipoid disorders: Secondary | ICD-10-CM

## 2023-08-06 DIAGNOSIS — Z7689 Persons encountering health services in other specified circumstances: Secondary | ICD-10-CM | POA: Diagnosis not present

## 2023-08-06 LAB — COMPREHENSIVE METABOLIC PANEL
ALT: 13 U/L (ref 0–35)
AST: 16 U/L (ref 0–37)
Albumin: 4.3 g/dL (ref 3.5–5.2)
Alkaline Phosphatase: 63 U/L (ref 39–117)
BUN: 10 mg/dL (ref 6–23)
CO2: 29 meq/L (ref 19–32)
Calcium: 9.2 mg/dL (ref 8.4–10.5)
Chloride: 102 meq/L (ref 96–112)
Creatinine, Ser: 0.63 mg/dL (ref 0.40–1.20)
GFR: 114.28 mL/min (ref >60.00)
Glucose, Bld: 90 mg/dL (ref 70–99)
Potassium: 4.1 meq/L (ref 3.5–5.1)
Sodium: 138 meq/L (ref 135–145)
Total Bilirubin: 1 mg/dL (ref 0.2–1.2)
Total Protein: 7.3 g/dL (ref 6.0–8.3)

## 2023-08-06 LAB — CBC
HCT: 38.6 % (ref 36.0–46.0)
Hemoglobin: 12.9 g/dL (ref 12.0–15.0)
MCHC: 33.3 g/dL (ref 30.0–36.0)
MCV: 91 fl (ref 78.0–100.0)
Platelets: 239 10*3/uL (ref 150.0–400.0)
RBC: 4.24 Mil/uL (ref 3.87–5.11)
RDW: 13 % (ref 11.5–15.5)
WBC: 6.3 10*3/uL (ref 4.0–10.5)

## 2023-08-06 LAB — LIPID PANEL
Cholesterol: 158 mg/dL (ref 0–200)
HDL: 61.6 mg/dL (ref >39.00)
LDL Cholesterol: 81 mg/dL (ref 0–99)
NonHDL: 95.98
Total CHOL/HDL Ratio: 3
Triglycerides: 77 mg/dL (ref 0.0–149.0)
VLDL: 15.4 mg/dL (ref 0.0–40.0)

## 2023-08-06 LAB — TSH: TSH: 2.19 u[IU]/mL (ref 0.35–5.50)

## 2023-08-06 LAB — POCT INFLUENZA A/B
Influenza A, POC: NEGATIVE
Influenza B, POC: NEGATIVE

## 2023-08-06 LAB — POC COVID19 BINAXNOW: SARS Coronavirus 2 Ag: NEGATIVE

## 2023-08-06 NOTE — Patient Instructions (Addendum)
 Stop by the lab prior to leaving today. I will notify you of your results once received.   You can try a few things over the counter to help with your symptoms including:  Cough: Delsym or Robitussin (get the off brand, works just as well) Chest Congestion: Mucinex (plain) Nasal Congestion/Ear Pressure/Sinus Pressure: Try using Flonase (fluticasone) nasal spray. Instill 1 spray in each nostril twice daily. This can be purchased over the counter. Body aches, fevers, headache: Ibuprofen (not to exceed 2400 mg in 24 hours) or Acetaminophen-Tylenol (not to exceed 3000 mg in 24 hours) Runny Nose/Throat Drainage/Sneezing/Itchy or Watery Eyes: An antihistamine such as Zyrtec, Claritin, Xyzal, Allegra  You should be feeling better by day seven of symptoms, but please do contact me if this is not the case.  Please schedule nurse visit for influenza vacine once your symptoms improve.  It was a pleasure meeting you!

## 2023-08-06 NOTE — Progress Notes (Signed)
 New Patient Office Visit  Subjective    Patient ID: Crystal Graves, female    DOB: 08-27-1985  Age: 38 y.o. MRN: [id number]  CC:  Chief Complaint  Patient presents with   New Patient (Initial Visit)   Sore Throat    And body aches since yesterday. Son has been sick since this weekend. Has not taken anything. No at home testing done on her or son.     HPI Crystal Graves Crystal Graves is a 38 y.o. female presents to establish care.  [place] Interpreter, Lilyan Punt, is present today to help with translation.   Sore throat: Symptom onset yesterday. Her son had sick this past weekend. She has some difficulty swallowing and some ear popping, but not painful. She denies any fever, chills, chest pain, shortness of breath or difficulty breathing. She has not taken her son to the doctor. She has not done any testing at flu and/or covid. She has not tried anything OTC.  Immunizations: -Tetanus: Completed in 2023 -Influenza: due for this season  Diet: Fair diet.  Exercise: No regular exercise.   Eye exam: several years ago.  Dental exam: Completes semi-annually    Pap Smear: Completed in 2023.   Outpatient Encounter Medications as of 08/06/2023  Medication Sig   [DISCONTINUED] acetaminophen (TYLENOL) 325 MG tablet Take 2 tablets (650 mg total) by mouth every 4 (four) hours as needed (for pain scale < 4). (Patient not taking: Reported on 08/15/2022)   [DISCONTINUED] benzocaine-Menthol (DERMOPLAST) 20-0.5 % AERO Apply 1 Application topically as needed for irritation (perineal discomfort). (Patient not taking: Reported on 08/15/2022)   [DISCONTINUED] fluticasone (FLONASE) 50 MCG/ACT nasal spray Place 2 sprays into both nostrils daily. (Patient not taking: Reported on 08/15/2022)   [DISCONTINUED] ibuprofen (ADVIL) 600 MG tablet Take 1 tablet (600 mg total) by mouth every 6 (six) hours. (Patient not taking: Reported on 08/15/2022)   [DISCONTINUED] Prenatal Vit-Fe Fumarate-FA  (MULTIVITAMIN-PRENATAL) 27-0.8 MG TABS tablet Take 1 tablet by mouth daily at 12 noon. (Patient not taking: Reported on 08/06/2023)   No facility-administered encounter medications on file as of 08/06/2023.    Past Medical History:  Diagnosis Date   ASCUS with positive high risk HPV cervical    Chlamydia infection 06/29/2017   Endocervical polyp 08/04/2021   Moderate dysplasia of cervix (CIN II) 11/11/2014   CIN II noted on colposcopy done after LGSIL pap on 11/10/14 Cryotherapy done on 12/10/14 06/22/15 Normal cytology on pap smear with positive HRHPV, but negative HPV 16 and 18/45 -> repeat cotesting in one year 06/27/16  ASCUS +HPV  Colposcopy showed CIN II, LEEP recommeded LEEP on 09/05/2016 showed CIN I.     Past Surgical History:  Procedure Laterality Date   COLPOSCOPY W/ BIOPSY / CURETTAGE  11/10/2014   Done for LGSIL pap smear   GYNECOLOGIC CRYOSURGERY  12/10/2014   Done for CIN II (colposcopy pathology after LGSIL pap)   INSERTION OF IMPLANON ROD  09/21/2020       POLYPECTOMY  08/04/2021   REMOVAL OF IMPLANON ROD  09/07/2021   TUBAL LIGATION N/A 07/16/2022   Procedure: BILATERAL TUBAL LIGATION;  Surgeon: Lazaro Arms, MD;  Location: [facility] LD ORS;  Service: Obstetrics;  Laterality: N/A;    Family History  Problem Relation Age of Onset   Hypertension Father    Diabetes Father    Diabetes Paternal Grandmother    Diabetes Paternal Grandfather    Asthma Neg Hx    Heart disease Neg Hx  Stroke Neg Hx     Social History   Socioeconomic History   Marital status: Married    Spouse name: Marily Memos   Number of children: 4   Years of education: Not on file   Highest education level: GED or equivalent  Occupational History   Not on file  Tobacco Use   Smoking status: Never   Smokeless tobacco: Never  Vaping Use   Vaping status: Never Used  Substance and Sexual Activity   Alcohol use: Not Currently    Alcohol/week: 1.0 standard drink of alcohol    Types: 1 Shots of liquor per week     Comment: Occasional   Drug use: No   Sexual activity: Yes    Partners: Male    Birth control/protection: None  Other Topics Concern   Not on file  Social History Narrative   Single.   2 children.   Works as a Chartered certified accountant.   She enjoys dancing, going to the movies.    Social Drivers of Corporate investment banker Strain: Low Risk  (08/02/2023)   Overall Financial Resource Strain (CARDIA)    Difficulty of Paying Living Expenses: Not hard at all  Food Insecurity: Unknown (08/02/2023)   Hunger Vital Sign    Worried About Running Out of Food in the Last Year: Never true    Ran Out of Food in the Last Year: Patient declined  Transportation Needs: No Transportation Needs (08/02/2023)   PRAPARE - Administrator, Civil Service (Medical): No    Lack of Transportation (Non-Medical): No  Physical Activity: Insufficiently Active (08/02/2023)   Exercise Vital Sign    Days of Exercise per Week: 1 day    Minutes of Exercise per Session: 90 min  Stress: No Stress Concern Present (08/02/2023)   [employer] of Occupational Health - Occupational Stress Questionnaire    Feeling of Stress : Not at all  Social Connections: Moderately Isolated (08/02/2023)   Social Connection and Isolation Panel [NHANES]    Frequency of Communication with Friends and Family: Once a week    Frequency of Social Gatherings with Friends and Family: Once a week    Attends Religious Services: 1 to 4 times per year    Active Member of [employer] or Organizations: No    Attends Engineer, structural: Not on file    Marital Status: Living with partner  Intimate Partner Violence: Not on file    Review of Systems  Constitutional:  Negative for chills, fever, malaise/fatigue and weight loss.  HENT:  Positive for congestion and sore throat. Negative for ear discharge, ear pain, hearing loss, nosebleeds, sinus pain and tinnitus.        "Ear popping"  Eyes:  Negative for blurred vision, double vision, pain,  discharge and redness.  Respiratory:  Negative for cough, shortness of breath, wheezing and stridor.   Cardiovascular:  Negative for chest pain, palpitations and leg swelling.  Gastrointestinal:  Negative for abdominal pain, constipation, diarrhea, heartburn, nausea and vomiting.  Genitourinary:  Negative for dysuria, frequency and urgency.  Musculoskeletal:  Negative for myalgias.  Skin:  Negative for rash.  Neurological:  Negative for dizziness, tingling, seizures, weakness and headaches.  Endo/Heme/Allergies:  Negative for polydipsia.  Psychiatric/Behavioral:  Negative for depression, substance abuse and suicidal ideas. The patient is not nervous/anxious.         Objective    BP 102/68 (BP Location: Left Arm, Patient Position: Sitting, Cuff Size: Normal)   Pulse 75  Temp 98.1 F (36.7 C) (Oral)   Ht 5' 6.5" (1.689 m)   Wt 135 lb (61.2 kg)   SpO2 99%   BMI 21.46 kg/m   Physical Exam Vitals and nursing note reviewed.  Constitutional:      Appearance: Normal appearance.  HENT:     Head: Normocephalic and atraumatic.     Right Ear: Ear canal and external ear normal. There is impacted cerumen.     Left Ear: Ear canal and external ear normal. There is impacted cerumen.     Nose: Nose normal.     Mouth/Throat:     Mouth: Mucous membranes are moist.     Pharynx: Oropharynx is clear.  Eyes:     Conjunctiva/sclera: Conjunctivae normal.     Pupils: Pupils are equal, round, and reactive to light.  Cardiovascular:     Rate and Rhythm: Normal rate and regular rhythm.     Pulses: Normal pulses.     Heart sounds: Normal heart sounds.  Pulmonary:     Effort: Pulmonary effort is normal.     Breath sounds: Normal breath sounds.  Abdominal:     General: Abdomen is flat. Bowel sounds are normal.     Palpations: Abdomen is soft.  Musculoskeletal:        General: Normal range of motion.     Cervical back: Normal range of motion.  Skin:    General: Skin is warm and dry.      Capillary Refill: Capillary refill takes less than 2 seconds.  Neurological:     General: No focal deficit present.     Mental Status: She is alert and oriented to person, place, and time. Mental status is at baseline.  Psychiatric:        Mood and Affect: Mood normal.        Behavior: Behavior normal.        Thought Content: Thought content normal.        Judgment: Judgment normal.         Assessment & Plan:  Encounter for screening and preventative care Assessment & Plan: Immunizations tetanus up to date. Flu (she will schedule nurse visit once she is feeling better) Pap smear UTD.  Discussed the importance of a healthy diet and regular exercise in order for weight loss, and to reduce the risk of further co-morbidity.  Exam stable. Labs pending.  Follow up in 1 year for repeat physical.   Orders: -     CBC -     Comprehensive metabolic panel -     TSH -     Lipid panel  Sore throat Assessment & Plan: Poc flu and covid negative.   Exam stable.   Suspect viral URI.   Recommendations given for OTC medications.  Discussed ER/UC precautions.   Orders: -     POCT Influenza A/B -     POC COVID-19 BinaxNow  Bilateral impacted cerumen Assessment & Plan: Bilateral cerumen impaction identified on exam. Patient consented to irrigation of canals bilaterally.  Bilateral canals irrigated. Patient tolerated well. TM's and canals post irrigation unremarkable.   Discussed home care instructions.    Encounter to establish care with new doctor Assessment & Plan: EMR reviewed briefly.   Encounter for screening for endocrine disorder -     TSH  Screening for lipid disorders -     Lipid panel    Return in about 1 year (around 08/05/2024) for physical.   [place] Charon, NP

## 2023-08-06 NOTE — Assessment & Plan Note (Signed)
 Poc flu and covid negative.   Exam stable.   Suspect viral URI.   Recommendations given for OTC medications.  Discussed ER/UC precautions.

## 2023-08-06 NOTE — Assessment & Plan Note (Signed)
 Immunizations tetanus up to date. Flu (she will schedule nurse visit once she is feeling better) Pap smear UTD.  Discussed the importance of a healthy diet and regular exercise in order for weight loss, and to reduce the risk of further co-morbidity.  Exam stable. Labs pending.  Follow up in 1 year for repeat physical.

## 2023-08-06 NOTE — Assessment & Plan Note (Signed)
 EMR reviewed briefly.

## 2023-08-06 NOTE — Assessment & Plan Note (Signed)
 Bilateral cerumen impaction identified on exam. Patient consented to irrigation of canals bilaterally.  Bilateral canals irrigated. Patient tolerated well. TM's and canals post irrigation unremarkable.   Discussed home care instructions.

## 2023-08-07 ENCOUNTER — Encounter: Payer: Self-pay | Admitting: General Practice

## 2023-08-21 ENCOUNTER — Ambulatory Visit

## 2024-08-06 ENCOUNTER — Encounter: Admitting: General Practice
